# Patient Record
Sex: Female | Born: 1990 | Race: White | Hispanic: No | Marital: Single | Smoking: Former smoker
Health system: Southern US, Community
[De-identification: ages and names within clinical notes are randomized; demographics above are authoritative.]

## PROBLEM LIST (undated history)

## (undated) DIAGNOSIS — N390 Urinary tract infection, site not specified: Secondary | ICD-10-CM

## (undated) HISTORY — DX: Urinary tract infection, site not specified: N39.0

---
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
## 2010-04-02 DIAGNOSIS — N390 Urinary tract infection, site not specified: Secondary | ICD-10-CM

## 2010-04-02 HISTORY — DX: Urinary tract infection, site not specified: N39.0

## 2010-07-17 LAB — CBC
HCT: 29.2 % — ABNORMAL LOW (ref 36.0–49.0)
HCT: 34.6 % — ABNORMAL LOW (ref 36.0–49.0)
Hemoglobin: 10.2 g/dL — ABNORMAL LOW (ref 12.0–16.0)
Hemoglobin: 11.9 g/dL — ABNORMAL LOW (ref 12.0–16.0)
MCHC: 34.4 g/dL (ref 31.0–37.0)
MCHC: 35 g/dL (ref 31.0–37.0)
MCV: 96.6 fL (ref 78.0–98.0)
MCV: 96.9 fL (ref 78.0–98.0)
Platelets: 183 10*3/uL (ref 150–400)
Platelets: 231 10*3/uL (ref 150–400)
RBC: 3.01 MIL/uL — ABNORMAL LOW (ref 3.80–5.70)
RBC: 3.59 MIL/uL — ABNORMAL LOW (ref 3.80–5.70)
RDW: 14.4 % (ref 11.4–15.5)
RDW: 14.6 % (ref 11.4–15.5)
WBC: 10.6 10*3/uL (ref 4.5–13.5)
WBC: 13.9 10*3/uL — ABNORMAL HIGH (ref 4.5–13.5)

## 2010-07-17 LAB — RPR: RPR Ser Ql: NONREACTIVE

## 2010-08-15 NOTE — Op Note (Signed)
NAME:  Kathryn Alvarez, Kathryn Alvarez               ACCOUNT NO.:  [id number]   MEDICAL RECORD NO.:  [id number]          PATIENT TYPE:  INP   LOCATION:  9134                          FACILITY:  [facility]   PHYSICIAN:  Roseanna Rainbow, M.D.DATE OF BIRTH:  1991-03-29   DATE OF PROCEDURE:  04/28/2008  DATE OF DISCHARGE:                               OPERATIVE REPORT   PREOPERATIVE DIAGNOSES:  1. Intrauterine pregnancy at term.  2. Arrest of descent.  3. Persistent occiput posterior.  4. Repetitive variable decelerations.   POSTOPERATIVE DIAGNOSES:  1. Intrauterine pregnancy at term.  2. Arrest of descent.  3. Persistent occiput posterior.  4. Repetitive variable decelerations.   PROCEDURE:  Primary low uterine flap elliptical cesarean delivery via a  transverse incision.   SURGEON:  Roseanna Rainbow, MD   ANESTHESIA:  Epidural.   FINDINGS:  The fetus was direct OP.  She was delivered of a live born  female.  Apgars 8 and 9 at one and five minutes respectively, the weight  was 7 pounds 15 ounces.  An umbilical artery pH was 7.31.   PATHOLOGY:  Placenta.   ESTIMATED BLOOD LOSS:  600 mL.   COMPLICATIONS:  Extension of the uterine incision at the left margin and  there was gross hematuria that was clearing at the end of the procedure.   PROCEDURE:  The patient was taken to the operating room with an epidural  catheter in situ and an IV running.  She was placed in the dorsal supine  position with a leftward tilt and prepped and draped in the usual  sterile fashion.  After a time-out had been completed, transverse skin  incision was then made with a scalpel and carried down to the underlying  fascia.  The fascia was nicked in the midline and the fascial incision  was then extended bilaterally.  The superior aspect of the fascial  incision was tented up and the underlying rectus muscles dissected off.  The inferior aspect of the fascial incision was manipulated in a similar  fashion.  The  rectus muscles were separated in the midline.  The  parietal peritoneum was tented up and entered sharply.  This incision  was then extended superiorly and inferiorly with good visualization of  the bladder.  The bladder blade was then placed.  The vesicouterine  peritoneum was tented up and entered sharply.  This incision was then  extended bilaterally and a bladder flap created bluntly.  Please note  that the bladder was slightly distended.  The lower uterine segment was  then incised in a transverse fashion, the uterine incision was then  extended bluntly.  The infant's head was delivered atraumatically with  assistance from below as well as elevation of the shoulders from above.  The oropharynx was suctioned with bulb suction.  The cord was clamped  and cut.  The infant was handed off to the awaiting neonatologist.  The  placenta was then removed.  The intrauterine cavity was evacuated of any  remaining amniotic fluid clots and debris with a moist laparotomy  sponge.  The 2-cm extension down towards  the cervix extending from the  left margin of the uterine incision was repaired using a running  interlocking suture of 0-Monocryl.  An imbricating layer of the same  suture was placed.  The remainder of the uterine incision was closed in  the usual fashion using a running interlocking suture of 0-Monocryl.  A  second imbricating layer of the same suture was then placed.  The  paracolic gutters were irrigated copiously.  The uterine incision was re-  inspected and felt to be hemostatic.  The parietal peritoneum was then  reapproximated in a running fashion using a suture of 2-0 Vicryl.  The  fascia was closed in a running fashion using two sutures of 0-Vicryl  tied in the midline.  The skin was closed with staples.  At the close of  the procedure, the instrument and pack counts were said to be correct  x2.  The patient was taken to the PACU awake and in stable condition.      Roseanna Rainbow, M.D.  Electronically Signed     LAJ/MEDQ  D:  04/28/2008  T:  04/28/2008  Job:  [id number]

## 2010-08-18 NOTE — Discharge Summary (Signed)
NAME:  Kathryn Alvarez, Kathryn Alvarez               ACCOUNT NO.:  [id number]   MEDICAL RECORD NO.:  [id number]          PATIENT TYPE:  INP   LOCATION:  9134                          FACILITY:  [facility]   PHYSICIAN:  Charles A. Clearance Coots, M.D.DATE OF BIRTH:  07-31-1990   DATE OF ADMISSION:  04/26/2008  DATE OF DISCHARGE:  04/30/2008                               DISCHARGE SUMMARY   ADMITTING DIAGNOSIS:  Post dates pregnancy, induction of labor.   DISCHARGE DIAGNOSES:  Post dates pregnancy, induction of labor status  post primary low transverse cesarean section on April 28, 2008, for  arrest of descent, persistent occiput posterior position, and repetitive  variable fetal heart rate decelerations.   Viable female infant was delivered on April 28, 2008, at 3:14.  Apgars 8  at one minute and 9 at five minutes, weight of 7 pounds and 15 ounces,  3615 g.  Mother and infant discharged home in good condition.   REASON FOR ADMISSION:  A 20 year old G1, estimated date of confinement  of April 21, 2008, presents for induction of labor for post dates.   PAST MEDICAL HISTORY:  Surgery:  Breast biopsy.  Illnesses:  None.   MEDICATIONS:  Prenatal vitamins.   ALLERGIES:  No known drug allergies.   SOCIAL HISTORY:  Single.  Positive tobacco.  Negative alcohol or  recreational drug use.   FAMILY HISTORY:  Positive for diabetes.   PHYSICAL EXAMINATION:  GENERAL:  Well-nourished, well-developed female  in no acute distress, afebrile.  VITAL SIGNS:  Stable.  LUNGS:  Clear to auscultation bilaterally.  HEART:  Regular rate and rhythm.  ABDOMEN:  Gravid, nontender.  Cervix fingertip, 50% effaced, vertex at -  2 station.   HOSPITAL COURSE:  The patient was admitted, two-stage induction was  begun with cervical ripening overnight followed by Pitocin the following  morning.  The patient progressed to 6 cm dilatation and then made  further progress to full dilatation, but with increased caput and  occiput posterior  position.  Fetal heart rate tracing then revealed  moderate variable fetal heart rate decelerations with minimal descent of  the vertex with increased caput.  The patient was managed conservatively  for the next 2-3 hours and had severe variable fetal heart rate  decelerations with pushing effort and no further descent of the vertex.  A decision was made to proceed with cesarean section delivery for arrest  of descent with persistent occiput posterior position and repetitive  variable fetal heart rate deceleration.  Primary low transverse cesarean  section was performed on April 28, 2008.  There were no intraoperative  complications.  Postoperative course was uncomplicated.  The patient was  discharged home on postop day #3 in good condition.   DISCHARGE LABORATORY DATA:  Hemoglobin 10, hematocrit 29.2, white blood  cell count 13,900, platelets 183,000.   DISCHARGE DISPOSITION:  Medication, continue prenatal vitamins,  ibuprofen and Percocet were prescribed for pain.  Routine written  instructions were given for discharge after cesarean section.  The  patient is to call the office for followup appointment in 2 weeks.      Leonette Most  Julio Alm, M.D.  Electronically Signed     CAH/MEDQ  D:  06/04/2008  T:  06/05/2008  Job:  [id number]

## 2010-11-10 LAB — ABO/RH: RH Type: POSITIVE

## 2010-11-10 LAB — ANTIBODY SCREEN: Antibody Screen: NEGATIVE

## 2011-01-02 LAB — CBC
HCT: 31 — ABNORMAL LOW
Hemoglobin: 10.8 — ABNORMAL LOW
MCHC: 35
MCV: 95.7
Platelets: 245
RBC: 3.24 — ABNORMAL LOW
RDW: 13.6
WBC: 9.6

## 2011-01-03 LAB — URINALYSIS, ROUTINE W REFLEX MICROSCOPIC
Bilirubin Urine: NEGATIVE
Glucose, UA: NEGATIVE
Hgb urine dipstick: NEGATIVE
Ketones, ur: NEGATIVE
Nitrite: NEGATIVE
Protein, ur: NEGATIVE
Specific Gravity, Urine: 1.025
Urobilinogen, UA: 0.2
pH: 6.5

## 2011-02-01 LAB — CBC: Platelets: 237 10*3/uL (ref 150–399)

## 2011-02-11 LAB — HEPATITIS B SURFACE ANTIGEN: Hepatitis B Surface Ag: NEGATIVE

## 2011-02-11 LAB — RPR: RPR: NONREACTIVE

## 2011-02-11 LAB — GC/CHLAMYDIA PROBE AMP, GENITAL
Chlamydia: NEGATIVE
Gonorrhea: NEGATIVE

## 2011-02-11 LAB — RUBELLA ANTIBODY, IGM: Rubella: IMMUNE

## 2011-02-11 LAB — VARICELLA ZOSTER ANTIBODY, IGG: Varicella: IMMUNE

## 2011-02-11 LAB — HIV ANTIBODY (ROUTINE TESTING W REFLEX): HIV: NONREACTIVE

## 2011-04-03 NOTE — L&D Delivery Note (Signed)
Operative Delivery Note At 4:54 PM a viable female was delivered via Vaginal, [employer], operational).  Presentation: vertex; Position: Right,, Occiput,, Anterior; Station: +3.  Verbal consent: obtained from patient.  Risks and benefits discussed in detail.  Risks include, but are not limited to the risks of anesthesia, bleeding, infection, damage to maternal tissues, fetal cephalhematoma.  There is also the risk of inability to effect vaginal delivery of the head, or shoulder dystocia that cannot be resolved by established maneuvers, leading to the need for emergency cesarean section.  APGAR: , ; weight .   Placenta status: , .   Cord: 3 vessels with the following complications: .  Cord pH: 7.29  Anesthesia: Epidural  Instruments: kiwi Episiotomy: none Lacerations: none Suture Repair: n/a Est. Blood Loss350 (mL):   Mom to postpartum.  Baby to nursery-stable.  Kathryn Alvarez DARLENE 06/03/2011, 5:11 PM

## 2011-04-19 LAB — CBC
HCT: 36 % (ref 36–46)
Hemoglobin: 11.5 g/dL — AB (ref 12.0–16.0)
Platelets: 265 10*3/uL (ref 150–399)

## 2011-04-19 LAB — RPR: RPR: NONREACTIVE

## 2011-04-19 LAB — HIV ANTIBODY (ROUTINE TESTING W REFLEX): HIV: NONREACTIVE

## 2011-05-29 DIAGNOSIS — O34219 Maternal care for unspecified type scar from previous cesarean delivery: Secondary | ICD-10-CM | POA: Insufficient documentation

## 2011-05-29 DIAGNOSIS — Z98891 History of uterine scar from previous surgery: Secondary | ICD-10-CM

## 2011-05-31 ENCOUNTER — Encounter: Payer: Self-pay | Admitting: *Deleted

## 2011-05-31 ENCOUNTER — Encounter: Payer: Self-pay | Admitting: Physician Assistant

## 2011-05-31 ENCOUNTER — Ambulatory Visit (INDEPENDENT_AMBULATORY_CARE_PROVIDER_SITE_OTHER): Payer: Self-pay | Admitting: Physician Assistant

## 2011-05-31 VITALS — BP 113/72 | HR 89 | Temp 97.9°F | Ht 62.0 in | Wt 195.2 lb

## 2011-05-31 DIAGNOSIS — O34219 Maternal care for unspecified type scar from previous cesarean delivery: Secondary | ICD-10-CM

## 2011-05-31 DIAGNOSIS — Z98891 History of uterine scar from previous surgery: Secondary | ICD-10-CM

## 2011-05-31 LAB — POCT URINALYSIS DIP (DEVICE)
Bilirubin Urine: NEGATIVE
Glucose, UA: NEGATIVE mg/dL
Hgb urine dipstick: NEGATIVE
Ketones, ur: NEGATIVE mg/dL
Nitrite: NEGATIVE
Protein, ur: NEGATIVE mg/dL
Specific Gravity, Urine: 1.025 (ref 1.005–1.030)
Urobilinogen, UA: 1 mg/dL (ref 0.0–1.0)
pH: 7 (ref 5.0–8.0)

## 2011-05-31 LAB — STREP B DNA PROBE: GBS: NEGATIVE

## 2011-05-31 NOTE — Progress Notes (Signed)
Pulse 89. Edema trace on feet. No c/o pain. Pressure on pelvica area.

## 2011-05-31 NOTE — Patient Instructions (Signed)
Vaginal Birth After Cesarean Delivery Vaginal birth after Cesarean delivery (VBAC) is giving birth vaginally after previously delivering a baby by a cesarean. In the past, if a woman had a Cesarean delivery, all births afterwards would be done by Cesarean delivery. This is no longer true. It can be safe for the mother to try a vaginal delivery after having a Cesarean. The final decision to have a VBAC or repeat Cesarean delivery should be between the patient and her caregiver. The risks and benefits can be discussed relative to the reason for, and the type of the previous Cesarean delivery. WOMEN WHO PLAN TO HAVE A VBAC SHOULD CHECK WITH THEIR DOCTOR TO BE SURE THAT:  The previous Cesarean was done with a low transverse uterine incision (not a vertical classical incision).   The birth canal is big enough for the baby.   There were no other operations on the uterus.   They will have an electronic fetal monitor (EFM) on at all times during labor.   An operating room would be available and ready in case an emergency Cesarean is needed.   A doctor and surgical nursing staff would be available at all times during labor to be ready to do an emergency Cesarean if necessary.   An anesthesiologist would be present in case an emergency Cesarean is needed.   The nursery is prepared and has adequate personnel and necessary equipment available to care for the baby in case of an emergency Cesarean.  BENEFITS OF VBAC:  Shorter stay in the hospital.   Lower delivery, nursery and hospital costs.   Less blood loss and need for blood transfusions.   Less fever and discomfort from major surgery.   Lower risk of blood clots.   Lower risk of infection.   Shorter recovery after going home.   Lower risk of other surgical complications, such as opening of the incision or hernia in the incision.   Decreased risk of injury to other organs.   Decreased risk for having to remove the uterus (hysterectomy).     Decreased risk for the placenta to completely or partially cover the opening of the uterus (placenta previa) with a future pregnancy.   Ability to have a larger family if desired.  RISKS OF A VBAC:  Rupture of the uterus.   Having to remove the uterus (hysterectomy) if it ruptures.   All the complications of major surgery and/or injury to other organs.   Excessive bleeding, blood clots and infection.   Lower Apgar scores (method to evaluate the newborn based on appearance, pulse, grimace, activity, and respiration) and more risks to the baby.   There is a higher risk of uterine rupture if you induce or augment labor.   There is a higher risk of uterine rupture if you use medications to ripen the cervix.  VBAC SHOULD NOT BE DONE IF:  The previous Cesarean was done with a vertical (classical) or T-shaped incision, or you do not know what kind of an incision was made.   You had a ruptured uterus.   You had surgery on your uterus.   You have medical or obstetrical problems.   There are problems with the baby.   There were two previous Cesarean deliveries and no vaginal deliveries.  OTHER FACTS TO KNOW ABOUT VBAC:  It is safe to have an epidural anesthetic with VBAC.   It is safe to turn the baby from a breech position (attempt an external cephalic version).   It is  safe to try a VBAC with twins.   Pregnancies later than 40 weeks have not been successful with VBAC.   There is an increased failure rate of a VBAC in obese pregnant women.   There is an increased failure rate with VABC if the baby weighs 8.8 pounds (4000 grams) or more.   There is an increased failure rate if the time between the Cesarean and VBAC is less than 19 months.   There is an increased failure rate if pre-eclampsia is present (high blood pressure, protein in the urine and swelling of face and extremities).   VBAC is very successful if there was a previous vaginal birth.   VBAC is very successful  when the labor starts spontaneously before the due date.   Delivery of VBAC is similar to having a normal spontaneous vaginal delivery.  It is important to discuss VBAC with your caregiver early in the pregnancy so you can understand the risks, benefits and options. It will give you time to decide what is best in your particular case relevant to the reason for your previous Cesarean delivery. It should be understood that medical changes in the mother or pregnancy may occur during the pregnancy, which make it necessary to change you or your caregiver's initial decision. The counseling, concerns and decisions should be documented in the medical record and signed by all parties. Document Released: 09/09/2006 Document Revised: 11/29/2010 Document Reviewed: 04/30/2008 [facility] Patient Information 2012 [place], [place].Natural Childbirth Natural childbirth is going through labor and delivery without any drugs to relieve pain. You also do not use fetal monitors, have a cesarean delivery, or get a sugical cut to enlarge the vaginal opening (episiotomy). With the help of a birthing professional (midwife), you will direct your own labor and delivery as you choose. Many women chose natural childbirth because they feel more in control and in touch with their labor and delivery. They are also concerned about the medications affecting themselves and the baby. Pregnant women with a high risk pregnancy should not attempt natural childbirth. It is better to deliver the infant in a hospital if an emergency situation arises. Sometimes, the caregiver has to intervene for the health and safety of the mother and infant. TWO TECHNIQUES FOR NATURAL CHILDBIRTH:   The Lamaze method. This method teaches women that having a baby is normal, healthy, and natural. It also teaches the mother to take a neutral position regarding pain medication and anesthesia and to make an informed decision if and when it is right for them.   The  Erven Colla (also called husband coached birth). This method teaches the father to be the birth coach and stresses a natural approach. It also encourages exercise and a balanced diet with good nutrition. The exercises teach relaxation and deep breathing techniques. However, there are also classes to prepare the parents for an emergency situation that may occur.  METHODS OF DEALING WITH LABOR PAIN AND DELIVERY:  Meditation.   Yoga.   Hypnosis.   Acupuncture.   Massage.   Changing positions (walking, rocking, showering, leaning on birth balls).   Lying in warm water or a jacuzzi.   Find an activity that keeps your mind off of the labor pain.   Listen to soft music.   Visual imagery (focus on a particular object).  BEFORE GOING INTO LABOR  Be sure you and your spouse/partner are in agreement to have natural childbirth.   Decide if your caregiver or a midwife will deliver your baby.  Decide if you will have your baby in the hospital, birthing center, or at home.   If you have children, make plans to have someone to take care of them when you go to the hospital.   Know the distance and the time it takes to go to the delivery center. Make a dry run to be sure.   Have a bag packed with a night gown, bathrobe, and toiletries ready to take when you go into labor.   Keep phone numbers of your family and friends handy if you need to call someone when you go into labor.   Your spouse or partner should go to all the teaching classes.   Talk with your caregiver about the possibility of a medical emergency and what will happen if that occurs.  ADVANTAGES OF NATURAL CHILDBIRTH  You are in control of your labor and delivery.   It is safe.   There are no medications or anesthetics that may affect you and the fetus.   There are no invasive procedures such as an episiotomy.   You and your partner will work together, which can increase your bond.   Meditation, yoga, massage, and  breathing exercises can be learned while pregnant and help you when you are in labor and at delivery.   In most delivery centers, the family and friends can be involved in the labor and delivery process.  DISADVANTAGES OF NATURAL CHILDBIRTH  You will experience pain during your labor and delivery.   The methods of helping relieve your labor pains may not work for you.   You may feel embarrassed, disappointed, and like a failure if you decide to change your mind during labor and not have natural childbirth.  AFTER THE DELIVERY  You will be very tired.   You will be uncomfortable because of your uterus contracting. You will feel soreness around the vagina.   You may feel cold and shaky.This is a natural reaction.   You will be excited, overwhelmed, accomplished, and proud to be a mother.  HOME CARE INSTRUCTIONS   Follow the advice and instructions of your caregiver.   Follow the instructions of your natural childbirth instructor (Lamaze or Bradley Method).  Document Released: 03/01/2008 Document Revised: 11/29/2010 Document Reviewed: 03/01/2008 [facility] Patient Information 2012 [place], [place].

## 2011-05-31 NOTE — Progress Notes (Signed)
   Subjective:    Kathryn Alvarez is a G3P2002 [redacted]w[redacted]d being seen today for her first obstetrical visit.  Her obstetrical history is significant for previous c-section x 2. First at 41 week for FTD after 3 hour 2nd stage, 2nd: RLTCS at 39 weeks. Patient uncertain intend to breast feed. Pregnancy history fully reviewed. Transfer of care from [facility] practice in [place]. Uncomplicated pregnancy. Planning TOLAC in [place], has continued desire.   Patient reports no complaints  Filed Vitals:   05/31/11 1001 05/31/11 1003  BP: 113/72   Pulse:  89  Temp: 97.9 F (36.6 C)   Height:  5\' 2"  (1.575 m)  Weight: 195 lb 3.2 oz (88.542 kg)     HISTORY: OB History    Grav Para Term Preterm Abortions TAB SAB Ect Mult Living   3 2 2       2      # Outc Date GA Lbr Len/2nd Wgt Sex Del Anes PTL Lv   1 TRM 3/10 [redacted]w[redacted]d  7lb15oz(3.6kg) M CS EPI     2 TRM 3/12 [redacted]w[redacted]d  7lb10oz(3.459kg) M CS EPI  Yes   3 CUR              Past Medical History  Diagnosis Date  . UTI (lower urinary tract infection) 2012    during 2 pregnancy  . 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 Cesarean section   . Breast fibroadenoma surgery 2006   Family History  Problem Relation Age of Onset  . Cancer Mother     cervical  . Asthma Brother   . Cancer Maternal Aunt     cervical  . Cancer Maternal Grandmother   . Diabetes Maternal Grandmother   . Asthma Maternal Grandmother      Exam    Uterine Size: 39 cm  Pelvic Exam:    Perineum: Normal Perineum   Vulva: normal   Vagina:  normal mucosa   Cervix: 4-5/70/vtx/-2   Adnexa: not evaluated   Bony Pelvis: gynecoid  System: Breast:  normal appearance, no masses or tenderness, Inspection negative   Skin: normal coloration and turgor, no rashes    Neurologic: oriented, normal, gait normal; reflexes normal and symmetric   Extremities: normal strength, tone, and muscle mass   HEENT NL appearance   Mouth/Teeth mucous membranes moist,  pharynx normal without lesions   Respiratory:  appears well, vitals normal, no respiratory distress, acyanotic, normal RR   Abdomen: Gravid nontender      Assessment:    Pregnancy: [id number] Patient Active Problem List  Diagnoses  . H/O: C-section    Desires TOLAC    Plan:    VBAC consent Reviewed plan for expectant management to 41 weeks given no risk factors. Will start antenatal testing at next visit for post-term. Will post RLTCS at next visit for 41 weeks if no labor this week. Problem list reviewed and updated. Labor precautions Benefits of breastfeeding reviewed, pt undecided.  Rosi Secrist E. 05/31/2011

## 2011-06-03 ENCOUNTER — Encounter (HOSPITAL_COMMUNITY): Payer: Self-pay | Admitting: Obstetrics and Gynecology

## 2011-06-03 ENCOUNTER — Encounter (HOSPITAL_COMMUNITY): Payer: Self-pay | Admitting: Anesthesiology

## 2011-06-03 ENCOUNTER — Inpatient Hospital Stay (HOSPITAL_COMMUNITY): Payer: Medicaid Other | Admitting: Anesthesiology

## 2011-06-03 ENCOUNTER — Inpatient Hospital Stay (HOSPITAL_COMMUNITY)
Admission: AD | Admit: 2011-06-03 | Discharge: 2011-06-04 | DRG: 775 | Disposition: A | Discharge status: Home / Self Care | Payer: Medicaid Other | Source: Ambulatory Visit | Attending: Obstetrics & Gynecology | Admitting: Obstetrics & Gynecology

## 2011-06-03 DIAGNOSIS — O34219 Maternal care for unspecified type scar from previous cesarean delivery: Secondary | ICD-10-CM

## 2011-06-03 LAB — CBC
Hemoglobin: 10.9 g/dL — ABNORMAL LOW (ref 12.0–15.0)
MCH: 32.5 pg (ref 26.0–34.0)
MCHC: 33.6 g/dL (ref 30.0–36.0)
MCV: 96.7 fL (ref 78.0–100.0)
RBC: 3.35 MIL/uL — ABNORMAL LOW (ref 3.87–5.11)

## 2011-06-03 LAB — GC/CHLAMYDIA PROBE AMP, GENITAL
Chlamydia: NEGATIVE
Gonorrhea: NEGATIVE

## 2011-06-03 LAB — CULTURE, BETA STREP (GROUP B ONLY)

## 2011-06-03 MED ORDER — DIPHENHYDRAMINE HCL 25 MG PO CAPS: 25.0000 mg | ORAL_CAPSULE | Freq: Four times a day (QID) | ORAL | Status: DC | PRN

## 2011-06-03 MED ORDER — ONDANSETRON HCL 4 MG/2ML IJ SOLN: 8.0000 mg | INTRAMUSCULAR | Status: DC

## 2011-06-03 MED ORDER — PHENYLEPHRINE 40 MCG/ML (10ML) SYRINGE FOR IV PUSH (FOR BLOOD PRESSURE SUPPORT): 80.0000 ug | PREFILLED_SYRINGE | INTRAVENOUS | Status: DC | PRN

## 2011-06-03 MED ORDER — SENNOSIDES-DOCUSATE SODIUM 8.6-50 MG PO TABS
2.0000 | ORAL_TABLET | Freq: Every day | ORAL | Status: DC
Start: 2011-06-03 — End: 2011-06-04
  Administered 2011-06-03: 2 via ORAL

## 2011-06-03 MED ORDER — TETANUS-DIPHTH-ACELL PERTUSSIS 5-2.5-18.5 LF-MCG/0.5 IM SUSP: 0.5000 mL | Freq: Once | INTRAMUSCULAR | Status: DC

## 2011-06-03 MED ORDER — ONDANSETRON HCL 4 MG/2ML IJ SOLN: 4.0000 mg | Freq: Four times a day (QID) | INTRAMUSCULAR | Status: DC | PRN

## 2011-06-03 MED ORDER — EPHEDRINE 5 MG/ML INJ: 10.0000 mg | INTRAVENOUS | Status: DC | PRN

## 2011-06-03 MED ORDER — WITCH HAZEL-GLYCERIN EX PADS: 1.0000 "application " | MEDICATED_PAD | CUTANEOUS | Status: DC | PRN

## 2011-06-03 MED ORDER — FENTANYL 2.5 MCG/ML BUPIVACAINE 1/10 % EPIDURAL INFUSION (WH - ANES)
14.0000 mL/h | INTRAMUSCULAR | Status: DC
  Administered 2011-06-03: 14 mL/h via EPIDURAL

## 2011-06-03 MED ORDER — CITRIC ACID-SODIUM CITRATE 334-500 MG/5ML PO SOLN: 30.0000 mL | ORAL | Status: DC | PRN

## 2011-06-03 MED ORDER — LACTATED RINGERS IV SOLN: 500.0000 mL | Freq: Once | INTRAVENOUS | Status: DC

## 2011-06-03 MED ORDER — LACTATED RINGERS IV SOLN: 500.0000 mL | INTRAVENOUS | Status: DC | PRN

## 2011-06-03 MED ORDER — PHENYLEPHRINE 40 MCG/ML (10ML) SYRINGE FOR IV PUSH (FOR BLOOD PRESSURE SUPPORT)
PREFILLED_SYRINGE | INTRAVENOUS | Status: AC
  Filled 2011-06-03: qty 5

## 2011-06-03 MED ORDER — IBUPROFEN 600 MG PO TABS
600.0000 mg | ORAL_TABLET | Freq: Four times a day (QID) | ORAL | Status: DC | PRN
  Administered 2011-06-03: 600 mg via ORAL
  Filled 2011-06-03: qty 1

## 2011-06-03 MED ORDER — DIPHENHYDRAMINE HCL 50 MG/ML IJ SOLN: 12.5000 mg | INTRAMUSCULAR | Status: DC | PRN

## 2011-06-03 MED ORDER — TERBUTALINE SULFATE 1 MG/ML IJ SOLN
INTRAMUSCULAR | Status: AC
  Filled 2011-06-03: qty 1

## 2011-06-03 MED ORDER — ONDANSETRON HCL 4 MG PO TABS: 4.0000 mg | ORAL_TABLET | ORAL | Status: DC | PRN

## 2011-06-03 MED ORDER — LIDOCAINE HCL (PF) 1 % IJ SOLN
INTRAMUSCULAR | Status: DC | PRN
  Administered 2011-06-03 (×3): 4 mL

## 2011-06-03 MED ORDER — EPHEDRINE 5 MG/ML INJ
INTRAVENOUS | Status: AC
  Filled 2011-06-03: qty 4

## 2011-06-03 MED ORDER — ONDANSETRON 8 MG/NS 50 ML IVPB
8.0000 mg | Freq: Once | INTRAVENOUS | Status: AC
  Administered 2011-06-03: 8 mg via INTRAVENOUS
  Filled 2011-06-03: qty 8

## 2011-06-03 MED ORDER — LANOLIN HYDROUS EX OINT: TOPICAL_OINTMENT | CUTANEOUS | Status: DC | PRN

## 2011-06-03 MED ORDER — OXYTOCIN 20 UNITS IN LACTATED RINGERS INFUSION - SIMPLE
125.0000 mL/h | Freq: Once | INTRAVENOUS | Status: DC
  Administered 2011-06-03: 999 mL/h via INTRAVENOUS

## 2011-06-03 MED ORDER — DIBUCAINE 1 % RE OINT: 1.0000 "application " | TOPICAL_OINTMENT | RECTAL | Status: DC | PRN

## 2011-06-03 MED ORDER — FENTANYL 2.5 MCG/ML BUPIVACAINE 1/10 % EPIDURAL INFUSION (WH - ANES)
INTRAMUSCULAR | Status: AC
  Filled 2011-06-03: qty 60

## 2011-06-03 MED ORDER — FLEET ENEMA 7-19 GM/118ML RE ENEM: 1.0000 | ENEMA | RECTAL | Status: DC | PRN

## 2011-06-03 MED ORDER — LACTATED RINGERS IV SOLN
INTRAVENOUS | Status: DC
  Administered 2011-06-03: 15:00:00 via INTRAVENOUS

## 2011-06-03 MED ORDER — IBUPROFEN 600 MG PO TABS
600.0000 mg | ORAL_TABLET | Freq: Four times a day (QID) | ORAL | Status: DC
  Administered 2011-06-04 (×2): 600 mg via ORAL
  Filled 2011-06-03 (×2): qty 1

## 2011-06-03 MED ORDER — ZOLPIDEM TARTRATE 5 MG PO TABS: 5.0000 mg | ORAL_TABLET | Freq: Every evening | ORAL | Status: DC | PRN

## 2011-06-03 MED ORDER — PRENATAL MULTIVITAMIN CH
1.0000 | ORAL_TABLET | Freq: Every day | ORAL | Status: DC
  Administered 2011-06-04: 1 via ORAL
  Filled 2011-06-03: qty 1

## 2011-06-03 MED ORDER — OXYCODONE-ACETAMINOPHEN 5-325 MG PO TABS: 1.0000 | ORAL_TABLET | ORAL | Status: DC | PRN

## 2011-06-03 MED ORDER — LIDOCAINE HCL (PF) 1 % IJ SOLN
30.0000 mL | INTRAMUSCULAR | Status: DC | PRN
  Filled 2011-06-03: qty 30

## 2011-06-03 MED ORDER — OXYTOCIN BOLUS FROM INFUSION
500.0000 mL | Freq: Once | INTRAVENOUS | Status: DC
  Filled 2011-06-03: qty 1000
  Filled 2011-06-03: qty 500

## 2011-06-03 MED ORDER — ACETAMINOPHEN 325 MG PO TABS: 650.0000 mg | ORAL_TABLET | ORAL | Status: DC | PRN

## 2011-06-03 MED ORDER — SIMETHICONE 80 MG PO CHEW: 80.0000 mg | CHEWABLE_TABLET | ORAL | Status: DC | PRN

## 2011-06-03 MED ORDER — BENZOCAINE-MENTHOL 20-0.5 % EX AERO: 1.0000 "application " | INHALATION_SPRAY | CUTANEOUS | Status: DC | PRN

## 2011-06-03 NOTE — H&P (Signed)
Kathryn Alvarez is a 21 y.o. female presenting for active labor. Maternal Medical History:  Reason for admission: Reason for admission: contractions.  Contractions: Onset was 3-5 hours ago.   Frequency: regular.   Perceived severity is moderate.    Fetal activity: Perceived fetal activity is normal.   Last perceived fetal movement was within the past hour.      OB History    Grav Para Term Preterm Abortions TAB SAB Ect Mult Living   3 2 2  0 0 0 0 0 0 2     Past Medical History  Diagnosis Date  . UTI (lower urinary tract infection) 2012    during 2 pregnancy   Past Surgical History  Procedure Date  . Cesarean section   . Breast fibroadenoma surgery 2006    bilat breast fibroids   Family History: family history includes Asthma in her brother and maternal grandmother; Cancer in her maternal aunt, maternal grandmother, and mother; and Diabetes in her maternal grandmother. Social History:  reports that she has been smoking Cigarettes.  She has a 1 pack-year smoking history. She has never used smokeless tobacco. She reports that she does not drink alcohol or use illicit drugs.  Review of Systems  Constitutional: Negative.   HENT: Negative.   Eyes: Negative.   Respiratory: Negative.   Cardiovascular: Negative.   Gastrointestinal: Positive for abdominal pain.  Genitourinary: Negative.   Musculoskeletal: Negative.   Skin: Negative.   Neurological: Negative.   Endo/Heme/Allergies: Negative.   Psychiatric/Behavioral: Negative.     Dilation: 7 Effacement (%): 100 Station: -1 Exam by:: J. Rasch RN  Blood pressure 146/78, pulse 107, temperature 97.9 F (36.6 C), temperature source Oral, resp. rate 18, height 5\' 2"  (1.575 m), weight 88.451 kg (195 lb). Maternal Exam:  Uterine Assessment: Contraction strength is moderate.  Contraction frequency is regular.   Abdomen: Patient reports no abdominal tenderness. Fetal presentation: vertex  Introitus: Normal vulva.   Fetal  Exam Fetal Monitor Review: Mode: ultrasound.   Baseline rate: 135.  Variability: moderate (6-25 bpm).   Pattern: no accelerations and no decelerations.    Fetal State Assessment: Category I - tracings are normal.     Physical Exam  Constitutional: She is oriented to person, place, and time. She appears well-developed and well-nourished.  HENT:  Head: Normocephalic.  Neck: Normal range of motion.  Cardiovascular: Normal rate, regular rhythm, normal heart sounds and intact distal pulses.   Respiratory: Effort normal and breath sounds normal.  GI: Soft. Bowel sounds are normal.  Genitourinary: Vagina normal and uterus normal.  Musculoskeletal: Normal range of motion.  Neurological: She is alert and oriented to person, place, and time. She has normal reflexes.  Skin: Skin is warm and dry.  Psychiatric: She has a normal mood and affect. Her behavior is normal. Judgment and thought content normal.    Prenatal labs: ABO, Rh: O/Positive/-- (08/10 0000) Antibody: Negative (08/10 0000) Rubella: Immune (11/11 0000) RPR: Nonreactive (01/17 0000)  HBsAg: Negative (11/11 0000)  HIV: Non-reactive (01/17 0000)  GBS: Negative (02/28 0000)   Assessment/Plan: Admit, anticipate vag delivery.   Kathryn Alvarez 06/03/2011, 3:02 PM

## 2011-06-03 NOTE — Progress Notes (Signed)
Vomited small amt of clear mucous

## 2011-06-03 NOTE — Anesthesia Procedure Notes (Signed)
Epidural Patient location during procedure: OB Start time: 06/03/2011 3:34 PM Reason for block: procedure for pain  Staffing Performed by: anesthesiologist   Preanesthetic Checklist Completed: patient identified, site marked, surgical consent, pre-op evaluation, timeout performed, IV checked, risks and benefits discussed and monitors and equipment checked  Epidural Patient position: sitting Prep: site prepped and draped and DuraPrep Patient monitoring: continuous pulse ox and blood pressure Approach: midline Injection technique: LOR air  Needle:  Needle type: Tuohy  Needle gauge: 17 G Needle length: 9 cm Needle insertion depth: 6 cm Catheter type: closed end flexible Catheter size: 19 Gauge Catheter at skin depth: 11 cm Test dose: negative  Assessment Events: blood not aspirated, injection not painful, no injection resistance, negative IV test and no paresthesia  Additional Notes Discussed risk of headache, infection, bleeding, nerve injury and failed or incomplete block.  Patient voices understanding and wishes to proceed.

## 2011-06-03 NOTE — H&P (Signed)
Pt reconsented for VBAC.  Pt understands risk of uterine rupture, fetal distress and death, maternal hemorrhage needing hysterectomy or death.  Type and cross x 2 units.  Anesthesia aware.

## 2011-06-03 NOTE — Progress Notes (Signed)
Pt presents to [facility]U with chief complaint of contractions. Labor started this morning at 0530; pt is a G3P2 prior c-section times 2. Pt desires trial of labor for attempted VBAC.

## 2011-06-03 NOTE — Progress Notes (Signed)
Patient ID: Kathryn Alvarez, female   DOB: 18-Sep-1990, 20 y.o.   MRN: [id number] @ 1604 IUPC was placed without difficulty. accel noted in FHR pattern, mod variability. Pt tolerated procedure well. (cm/+1 station.

## 2011-06-03 NOTE — Progress Notes (Addendum)
Started pushing c Dr Penne Lash

## 2011-06-03 NOTE — Progress Notes (Addendum)
LOT position rotated tp ROA by Dr Penne Lash

## 2011-06-03 NOTE — Anesthesia Postprocedure Evaluation (Signed)
  Anesthesia Post-op Note  Patient: Kathryn Alvarez  Procedure(s) Performed: * No procedures listed *  Patient Location: PACU and Mother/Baby  Anesthesia Type: Epidural  Level of Consciousness: awake, alert  and oriented  Airway and Oxygen Therapy: Patient Spontanous Breathing   Post-op Assessment: Patient's Cardiovascular Status Stable and Respiratory Function Stable  Post-op Vital Signs: stable  Complications: No apparent anesthesia complications

## 2011-06-03 NOTE — Progress Notes (Signed)
Kathryn Alvarez is a delivery at this time. Notified her of patient arrival and cervical exam. Admit to L/D.

## 2011-06-03 NOTE — Anesthesia Preprocedure Evaluation (Signed)
Anesthesia Evaluation  Patient identified by MRN, date of birth, ID band Patient awake    Reviewed: Allergy & Precautions, H&P , NPO status , Patient's Chart, lab work & pertinent test results, reviewed documented beta blocker date and time   History of Anesthesia Complications Negative for: history of anesthetic complications  Airway Mallampati: II TM Distance: >3 FB Neck ROM: full    Dental  (+) Teeth Intact   Pulmonary Current Smoker,  breath sounds clear to auscultation        Cardiovascular negative cardio ROS  Rhythm:regular Rate:Normal     Neuro/Psych negative neurological ROS  negative psych ROS   GI/Hepatic negative GI ROS, Neg liver ROS,   Endo/Other  obese  Renal/GU negative Renal ROS  negative genitourinary   Musculoskeletal   Abdominal   Peds  Hematology negative hematology ROS (+)   Anesthesia Other Findings   Reproductive/Obstetrics (+) Pregnancy (h/o c/s x2 (last was less than a year ago 06/19/10), for [facility])                           Anesthesia Physical Anesthesia Plan  ASA: II  Anesthesia Plan: Epidural   Post-op Pain Management:    Induction:   Airway Management Planned:   Additional Equipment:   Intra-op Plan:   Post-operative Plan:   Informed Consent: I have reviewed the patients History and Physical, chart, labs and discussed the procedure including the risks, benefits and alternatives for the proposed anesthesia with the patient or authorized representative who has indicated his/her understanding and acceptance.     Plan Discussed with:   Anesthesia Plan Comments:         Anesthesia Quick Evaluation

## 2011-06-04 MED ORDER — IBUPROFEN 600 MG PO TABS: 600.0000 mg | ORAL_TABLET | Freq: Four times a day (QID) | ORAL | Status: AC | PRN

## 2011-06-04 MED ORDER — OXYCODONE-ACETAMINOPHEN 5-325 MG PO TABS: 1.0000 | ORAL_TABLET | ORAL | Status: AC | PRN

## 2011-06-04 MED ORDER — MULTIVITAMINS PO TABS: 1.0000 | ORAL_TABLET | Freq: Every day | ORAL | Status: AC

## 2011-06-04 MED ORDER — RHO D IMMUNE GLOBULIN 1500 UNIT/2ML IJ SOLN
300.0000 ug | Freq: Once | INTRAMUSCULAR | Status: AC
  Administered 2011-06-04: 300 ug via INTRAMUSCULAR
  Filled 2011-06-04: qty 2

## 2011-06-04 NOTE — Progress Notes (Signed)
UR chart review completed.  

## 2011-06-04 NOTE — Discharge Summary (Signed)
Obstetric Discharge Summary Reason for Admission: onset of labor Prenatal Procedures: none Intrapartum Procedures: vacuum Postpartum Procedures: none Complications-Operative and Postpartum: none Hemoglobin  Date Value Range Status  06/03/2011 10.9* 12.0-15.0 (g/dL) Final     HCT  Date Value Range Status  06/03/2011 32.4* 36.0-46.0 (%) Final    Discharge Diagnoses: Term Pregnancy-delivered; Pt had a successful VBAC after 2 cesarean sections.  Vacuum used for bradycardia while head crowning.  Discharge Information: Date: 06/04/2011 Activity: pelvic rest Diet: routine Medications: multivitamin, ibuprofen, percocet Condition: stable Instructions: refer to practice specific booklet Discharge to: home   Newborn Data: Live born female  Birth Weight: 7 lb 7.4 oz (3385 g) APGAR: 7, 9  Home with mother.  Shelina Luo H. 06/04/2011, 7:07 AM

## 2011-06-04 NOTE — Discharge Instructions (Signed)
Levonorgestrel intrauterine device (IUD) What is this medicine? LEVONORGESTREL IUD (LEE voe nor jes trel) is a contraceptive (birth control) device. It is used to prevent pregnancy and to treat heavy bleeding that occurs during your period. It can be used for up to 5 years. This medicine may be used for other purposes; ask your health care provider or pharmacist if you have questions. What should I tell my health care provider before I take this medicine? They need to know if you have any of these conditions: -abnormal Pap smear -cancer of the breast, uterus, or cervix -diabetes -endometritis -genital or pelvic infection now or in the past -have more than one sexual partner or your partner has more than one partner -heart disease -history of an ectopic or tubal pregnancy -immune system problems -IUD in place -liver disease or tumor -problems with blood clots or take blood-thinners -use intravenous drugs -uterus of unusual shape -vaginal bleeding that has not been explained -an unusual or allergic reaction to levonorgestrel, other hormones, silicone, or polyethylene, medicines, foods, dyes, or preservatives -pregnant or trying to get pregnant -breast-feeding How should I use this medicine? This device is placed inside the uterus by a health care professional. Talk to your pediatrician regarding the use of this medicine in children. Special care may be needed. Overdosage: If you think you have taken too much of this medicine contact a poison control center or emergency room at once. NOTE: This medicine is only for you. Do not share this medicine with others. What if I miss a dose? This does not apply. What may interact with this medicine? Do not take this medicine with any of the following medications: -amprenavir -bosentan -fosamprenavir This medicine may also interact with the following medications: -aprepitant -barbiturate medicines for inducing sleep or treating  seizures -bexarotene -griseofulvin -medicines to treat seizures like carbamazepine, ethotoin, felbamate, oxcarbazepine, phenytoin, topiramate -modafinil -pioglitazone -rifabutin -rifampin -rifapentine -some medicines to treat HIV infection like atazanavir, indinavir, lopinavir, nelfinavir, tipranavir, ritonavir -St. John's wort -warfarin This list may not describe all possible interactions. Give your health care provider a list of all the medicines, herbs, non-prescription drugs, or dietary supplements you use. Also tell them if you smoke, drink alcohol, or use illegal drugs. Some items may interact with your medicine. What should I watch for while using this medicine? Visit your doctor or health care professional for regular check ups. See your doctor if you or your partner has sexual contact with others, becomes HIV positive, or gets a sexual transmitted disease. This product does not protect you against HIV infection (AIDS) or other sexually transmitted diseases. You can check the placement of the IUD yourself by reaching up to the top of your vagina with clean fingers to feel the threads. Do not pull on the threads. It is a good habit to check placement after each menstrual period. Call your doctor right away if you feel more of the IUD than just the threads or if you cannot feel the threads at all. The IUD may come out by itself. You may become pregnant if the device comes out. If you notice that the IUD has come out use a backup birth control method like condoms and call your health care provider. Using tampons will not change the position of the IUD and are okay to use during your period. What side effects may I notice from receiving this medicine? Side effects that you should report to your doctor or health care professional as soon as possible: -allergic reactions  like skin rash, itching or hives, swelling of the face, lips, or tongue -fever, flu-like symptoms -genital sores -high  blood pressure -no menstrual period for 6 weeks during use -pain, swelling, warmth in the leg -pelvic pain or tenderness -severe or sudden headache -signs of pregnancy -stomach cramping -sudden shortness of breath -trouble with balance, talking, or walking -unusual vaginal bleeding, discharge -yellowing of the eyes or skin Side effects that usually do not require medical attention (report to your doctor or health care professional if they continue or are bothersome): -acne -breast pain -change in sex drive or performance -changes in weight -cramping, dizziness, or faintness while the device is being inserted -headache -irregular menstrual bleeding within first 3 to 6 months of use -nausea This list may not describe all possible side effects. Call your doctor for medical advice about side effects. You may report side effects to FDA at 1-800-FDA-1088. Where should I keep my medicine? This does not apply. NOTE: This sheet is a summary. It may not cover all possible information. If you have questions about this medicine, talk to your doctor, pharmacist, or health care provider.  2012, [employer]. (04/09/2008 6:39:08 PM)Postpartum Care After Vaginal Delivery After you deliver your baby, you will stay in the hospital for 24 to 72 hours, unless there were problems with the labor or delivery, or you have medical problems. While you are in the hospital, you will receive help and instructions on how to care for yourself and your baby. Your doctor will order pain medicine, in case you need it. You will have a small amount of bleeding from your vagina and should change your sanitary pad frequently. Wash your hands thoroughly with soap and water for at least 20 seconds after changing pads and using the toilet. Let the nurses know if you begin to pass blood clots or your bleeding increases. Do not flush blood clots down the toilet before having the nurse look at them, to make sure there is no  placental tissue with them. If you had an intravenous (IV), it will be removed within 24 hours, if there are no problems. The first time you get out of bed or take a shower, call the nurse to help you because you may get weak, lightheaded, or even faint. If you are breastfeeding, you may feel painful contractions of your uterus for a couple of weeks. This is normal. The contractions help your uterus get back to normal size. If you are not breastfeeding, wear a supportive bra and handle your breasts as little as possible until your milk has dried up. Hormones should not be given to dry up the breasts, because they can cause blood clots. You will be given your normal diet, unless you have diabetes or other medical problems.  The nurses may put an ice pack on your episiotomy (surgically enlarged opening), if you have one, to reduce the pain and swelling. On rare occasions, you may not be able to urinate and the nurse will need to empty your bladder with a catheter. If you had a postpartum tubal ligation ("tying tubes," female sterilization), it should not make your stay in the hospital longer. You may have your baby in your room with you as much as you like, unless you or the baby has a problem. Use the bassinet (basket) for the baby when going to and from the nursery. Do not carry the baby. Do not leave the postpartum area. If the mother is Rh negative (lacks a protein on the  red blood cells) and the baby is Rh positive, the mother should get a Rho-gam shot to prevent Rh problems with future pregnancies. You may be given written instructions for you and your baby, and necessary medicines, when you are discharged from the hospital. Be sure you understand and follow the instructions as advised. HOME CARE INSTRUCTIONS   Follow instructions and take the medicines given to you.   Only take over-the-counter or prescription medicines for pain, discomfort, or fever as directed by your caregiver.   Do not take  aspirin, because it can cause bleeding.   Increase your activities a little bit every day to build up your strength and endurance.   Do not drink alcohol, especially if you are breastfeeding or taking pain medicine.   Take your temperature twice a day and record it.   You may have a small amount of bleeding or spotting for 2 to 4 weeks. This is normal.   Do not use tampons or douche. Use sanitary pads.   Try to have someone stay and help you for a few days when you go home.   Try to rest or take a nap when the baby is sleeping.   If you are breastfeeding, wear a good support bra. If you are not breastfeeding, wear a supportive bra and do not stimulate your nipples.   Eat a healthy, nutritious diet and continue to take your prenatal vitamins.   Do not drive, do any heavy activities, or travel until your caregiver tells you it is okay.   Do not have intercourse until your caregiver gives you permission to do so.   Ask your caregiver when you can begin to exercise and what type of exercises to do.   Call your caregiver if you think you are having a problem from your delivery.   Call your pediatrician if you are having a problem with the baby.   Schedule your postpartum visit and keep it.  SEEK MEDICAL CARE IF:   You have a temperature of 100 F (37.8 C) or higher.   You have increased vaginal bleeding or are passing clots. Save any clots to show your caregiver.   You have bloody urine or pain when you urinate.   You have a bad smelling vaginal discharge.   You have increasing pain or swelling on your episiotomy.   You develop a severe headache.   You feel depressed.   The episiotomy is separating.   You become dizzy or lightheaded.   You develop a rash.   You have a reaction or problems with your medicine.   You have pain, redness, or swelling at the intravenous site.  SEEK IMMEDIATE MEDICAL CARE IF:   You have chest pain.   You develop shortness of breath.     You pass out.   You develop pain, with or without swelling or redness in your leg.   You develop heavy vaginal bleeding, with or without blood clots.   You develop stomach pain.   You develop a bad smelling vaginal discharge.  MAKE SURE YOU:   Understand these instructions.   Will watch your condition.   Will get help right away if you are not doing well or get worse.  Document Released: 01/14/2007 Document Revised: 03/08/2011 Document Reviewed: 01/26/2009 [facility] Patient Information 2012 [place], [place].

## 2011-06-04 NOTE — Progress Notes (Signed)
Post Partum Day 1 Subjective: no complaints, up ad lib, voiding and tolerating PO  Objective: Blood pressure 105/68, pulse 80, temperature 97.7 F (36.5 C), temperature source Oral, resp. rate 16, height 5\' 2"  (1.575 m), weight 195 lb (88.451 kg), SpO2 84.00%, unknown if currently breastfeeding.  Physical Exam:  General: alert, cooperative and no distress Lochia: appropriate Uterine Fundus: firm Incision: n/a DVT Evaluation: No evidence of DVT seen on physical exam. Negative Homan's sign. No cords or calf tenderness.   Basename 06/03/11 1450  HGB 10.9*  HCT 32.4*    Assessment/Plan: Discharge home and Contraception mirena   LOS: 1 day   Kathryn Alvarez H. 06/04/2011, 7:03 AM

## 2011-06-05 LAB — RH IG WORKUP (INCLUDES ABO/RH)
ABO/RH(D): O NEG
Antibody Screen: NEGATIVE
Gestational Age(Wks): 39

## 2011-06-07 ENCOUNTER — Other Ambulatory Visit: Payer: Self-pay

## 2011-07-12 ENCOUNTER — Ambulatory Visit (INDEPENDENT_AMBULATORY_CARE_PROVIDER_SITE_OTHER): Payer: Self-pay | Admitting: Physician Assistant

## 2011-07-12 ENCOUNTER — Encounter: Payer: Self-pay | Admitting: Physician Assistant

## 2011-07-12 NOTE — Progress Notes (Signed)
  Subjective:     Kathryn Alvarez is a 21 y.o. female who presents for a postpartum visit. She is 4 weeks postpartum following a VBAC after 2 c-sections. I have fully reviewed the prenatal and intrapartum course. The delivery was at 41 gestational weeks. Outcome: vaginal birth after cesarean (VBAC). Anesthesia: epidural. Postpartum course has been uncomplicated. Baby's course has been uncomplicated. Baby is feeding by bottle - . Bleeding no bleeding. Bowel function is normal. Bladder function is normal. Patient is sexually active. Contraception method is condoms. Postpartum depression screening: negative. LMP 06/25/2011. Intercourse on Sunday w/o complaint.  The following portions of the patient's history were reviewed and updated as appropriate: allergies, current medications, past family history, past medical history, past social history, past surgical history and problem list.  Review of Systems A comprehensive review of systems was negative.   Objective:    BP 136/90  Pulse 80  Temp(Src) 97 F (36.1 C) (Oral)  Ht 5\' 2"  (1.575 m)  Wt 165 lb 14.4 oz (75.252 kg)  BMI 30.34 kg/m2  Breastfeeding? No  General:  alert, cooperative and no distress   Breasts:  inspection negative, no nipple discharge or bleeding, no masses or nodularity palpable        Assessment:    4 postpartum exam. Pap smear done at today's visit.   Plan:    1. Contraception: IUD 2. Abstinence until IUD insert 3. Follow up in: 2 weeks or as needed. for IUD

## 2011-08-08 ENCOUNTER — Ambulatory Visit (INDEPENDENT_AMBULATORY_CARE_PROVIDER_SITE_OTHER): Payer: Medicaid Other | Admitting: Advanced Practice Midwife

## 2011-08-08 ENCOUNTER — Encounter: Payer: Self-pay | Admitting: Advanced Practice Midwife

## 2011-08-08 VITALS — BP 116/77 | HR 78 | Temp 97.0°F | Ht 61.0 in | Wt 154.6 lb

## 2011-08-08 DIAGNOSIS — Z9189 Other specified personal risk factors, not elsewhere classified: Secondary | ICD-10-CM

## 2011-08-08 DIAGNOSIS — Z98891 History of uterine scar from previous surgery: Secondary | ICD-10-CM

## 2011-08-08 DIAGNOSIS — Z30430 Encounter for insertion of intrauterine contraceptive device: Secondary | ICD-10-CM

## 2011-08-08 DIAGNOSIS — Z01812 Encounter for preprocedural laboratory examination: Secondary | ICD-10-CM

## 2011-08-08 DIAGNOSIS — Z9889 Other specified postprocedural states: Secondary | ICD-10-CM

## 2011-08-08 DIAGNOSIS — Z202 Contact with and (suspected) exposure to infections with a predominantly sexual mode of transmission: Secondary | ICD-10-CM

## 2011-08-08 MED ORDER — LEVONORGESTREL 20 MCG/24HR IU IUD
INTRAUTERINE_SYSTEM | Freq: Once | INTRAUTERINE | Status: AC
  Administered 2011-08-08: 1 via INTRAUTERINE

## 2011-08-08 MED ORDER — LEVONORGESTREL 20 MCG/24HR IU IUD: 1.0000 | INTRAUTERINE_SYSTEM | Freq: Once | INTRAUTERINE | Status: DC

## 2011-08-08 MED ORDER — IBUPROFEN 600 MG PO TABS: 600.0000 mg | ORAL_TABLET | Freq: Four times a day (QID) | ORAL | Status: AC | PRN

## 2011-08-08 NOTE — Patient Instructions (Signed)
Intrauterine Device Insertion Care After Refer to this sheet in the next few weeks. These instructions provide you with information on caring for yourself after your procedure. Your caregiver may also give you more specific instructions. Your treatment has been planned according to current medical practices, but problems sometimes occur. Call your caregiver if you have any problems or questions after your procedure. HOME CARE INSTRUCTIONS   Only take over-the-counter or prescription medicines for pain, discomfort, or fever as directed by your caregiver. Do not use aspirin. This may increase bleeding.   Check your IUD to make sure it is in place before you resume sexual activity. You should be able to feel the strings. If you cannot feel the strings, something may be wrong. The IUD may have fallen out of the uterus, or the uterus may have been punctured (perforated) during placement. Also, if the strings are getting longer, it may mean that the IUD is being forced out of the uterus. You no longer have full protection from pregnancy if any of these problems occur.   You may resume sexual intercourse if you are not having problems with the IUD. The IUD is considered immediately effective.   You may resume normal activities.   Keep all follow-up appointments to be sure your IUD has remained in place. After the first exam, yearly exams are advised, unless you cannot feel the strings of your IUD.   Continue to check that the IUD is still in place by feeling for the strings after every menstrual period.  SEEK MEDICAL CARE IF:   You have bleeding that is heavier or lasts longer than a normal menstrual cycle.   You have a fever.   You have increasing cramps or abdominal pain not relieved with medicine.   You have abdominal pain that does not seem to be related to the same area of earlier cramping and pain.   You are lightheaded, unusually weak, or faint.   You have abnormal vaginal discharge or  smells.   You have pain during sexual intercourse.   You cannot feel the IUD strings, or the IUD string has gotten longer.   You feel the IUD at the opening of the cervix in the vagina.   You think you are pregnant, or you miss your menstrual period.   The IUD string is hurting your sex partner.  Document Released: 11/15/2010 Document Revised: 03/08/2011 Document Reviewed: 11/15/2010 [facility] Patient Information 2012 [place], [place].  Intrauterine Device Insertion Most often, an intrauterine device (IUD) is inserted into the uterus to prevent pregnancy. There are 2 types of IUDs available:  Copper IUD. This type of IUD creates an environment that is not favorable to sperm survival. The mechanism of action of the copper IUD is not known for certain. It can stay in place for 10 years.   Hormone IUD. This type of IUD contains the hormone progestin (synthetic progesterone). The progestin thickens the cervical mucus and prevents sperm from entering the uterus, and it also thins the uterine lining. There is no evidence that the hormone IUD prevents implantation. The hormone IUD can stay in place for up to 5 years.  An IUD is the most cost-effective birth control if left in place for the full duration. It may be removed at any time. LET YOUR CAREGIVER KNOW ABOUT:  Sensitivity to metals.   Medicines taken including herbs, eyedrops, over-the-counter medicines, and creams.   Use of steroids (by mouth or creams).   Previous problems with anesthetics or numbing  medicine.   Previous gynecological surgery.   History of blood clots or clotting disorders.   Possibility of pregnancy.   Menstrual irregularities.   Concerns regarding unusual vaginal discharge or odors.   Previous experience with an IUD.   Other health problems.  RISKS AND COMPLICATIONS  Accidental puncture (perforation) of the uterus.   Accidental placement of the IUD either in the muscle layer of the uterus  (myometrium) or outside the uterus. If this happen, the IUD can be found essentially floating around the bowels. When this happens, the IUD must be taken out surgically.   The IUD may fall out of the uterus (expulsion). This is more common in women who have recently had a child.    Pregnancy in the fallopian tube (ectopic).  BEFORE THE PROCEDURE  Schedule the IUD insertion for when you will have your menstrual period or right after, to make sure you are not pregnant. Placement of the IUD is better tolerated shortly after a menstrual cycle.   You may need to take tests or be examined to make sure you are not pregnant.   You may be required to take a pregnancy test.   You may be required to get checked for sexually transmitted infections (STIs) prior to placement. Placing an IUD in someone who has an infection can make an infection worse.   You may be given a pain reliever to take 1 or 2 hours before the procedure.   An exam will be performed to determine the size and position of your uterus.   Ask your caregiver about changing or stopping your regular medicines.  PROCEDURE   A tool (speculum) is placed in the vagina. This allows your caregiver to see the lower part of the uterus (cervix).   The cervix is prepped with a medicine that lowers the risk of infection.   You may be given a medicine to numb each side of the cervix (intracervical or paracervical block). This is used to block and control any discomfort with insertion.   A tool (uterine sound) is inserted into the uterus to determine the length of the uterine cavity and the direction the uterus may be tilted.   A slim instrument (IUD inserter) is inserted through the cervical canal and into your uterus.   The IUD is placed in the uterine cavity and the insertion device is removed.   The nylon string that is attached to the IUD, and used for eventual IUD removal, is trimmed. It is trimmed so that it lays high in the vagina,  just outside the cervix.  AFTER THE PROCEDURE  You may have bleeding after the procedure. This is normal. It varies from light spotting for a few days to menstrual-like bleeding.   You may have mild cramping.   Practice checking the string coming out of the cervix to make sure the IUD remains in the uterus. If you cannot feel the string, you should schedule a "string check" with your caregiver.   If you had a hormone IUD inserted, expect that your period may be lighter or nonexistent within a year's time (though this is not always the case). There may be delayed fertility with the hormone IUD as a result of its progesterone effect. When you are ready to become pregnant, it is suggested to have the IUD removed up to 1 year in advance.   Yearly exams are advised.  Document Released: 11/15/2010 Document Revised: 03/08/2011 Document Reviewed: 11/15/2010 [facility] Patient Information 2012 [place], [place].

## 2011-08-08 NOTE — Progress Notes (Signed)
IUD Insertion Procedure Note  Pre-operative Diagnosis: undesired fertility  Post-operative Diagnosis: same  Indications: contraception  Procedure Details  Urine pregnancy test was done today and result was negative. GC/CT cultures obtained. No IC x 3 weeks. The risks (including infection, bleeding, pain, and uterine perforation) and benefits of the procedure were explained to the patient and Written informed consent was obtained.    Cervix cleansed with Betadine. Uterus retroverted per bimanual exam. Uterus sounded to 7 cm. IUD inserted without difficulty. String visible and trimmed. Patient tolerated procedure well.  IUD Information: Mirena.  Condition: Stable  Complications: None  Plan:  The patient was advised to call for any fever or for prolonged or severe pain or bleeding. She was advised to use Ibuprofen as needed for mild to moderate pain.   Attending Physician Documentation: I was performed the entire procedure, including opening and closing.  Dorathy Kinsman 08/08/2011 4:45 PM

## 2011-08-09 LAB — GC/CHLAMYDIA PROBE AMP, GENITAL: GC Probe Amp, Genital: NEGATIVE

## 2011-09-19 ENCOUNTER — Ambulatory Visit: Payer: Medicaid Other | Admitting: Advanced Practice Midwife

## 2012-11-09 ENCOUNTER — Emergency Department (HOSPITAL_BASED_OUTPATIENT_CLINIC_OR_DEPARTMENT_OTHER)
Admission: EM | Admit: 2012-11-09 | Discharge: 2012-11-09 | Disposition: A | Discharge status: Home / Self Care | Payer: Self-pay | Attending: Emergency Medicine | Admitting: Emergency Medicine

## 2012-11-09 ENCOUNTER — Encounter (HOSPITAL_BASED_OUTPATIENT_CLINIC_OR_DEPARTMENT_OTHER): Payer: Self-pay | Admitting: Emergency Medicine

## 2012-11-09 DIAGNOSIS — M545 Low back pain, unspecified: Secondary | ICD-10-CM | POA: Insufficient documentation

## 2012-11-09 DIAGNOSIS — Z3202 Encounter for pregnancy test, result negative: Secondary | ICD-10-CM | POA: Insufficient documentation

## 2012-11-09 DIAGNOSIS — N760 Acute vaginitis: Secondary | ICD-10-CM | POA: Insufficient documentation

## 2012-11-09 DIAGNOSIS — N898 Other specified noninflammatory disorders of vagina: Secondary | ICD-10-CM | POA: Insufficient documentation

## 2012-11-09 DIAGNOSIS — N390 Urinary tract infection, site not specified: Secondary | ICD-10-CM | POA: Insufficient documentation

## 2012-11-09 DIAGNOSIS — Z87891 Personal history of nicotine dependence: Secondary | ICD-10-CM | POA: Insufficient documentation

## 2012-11-09 DIAGNOSIS — B9689 Other specified bacterial agents as the cause of diseases classified elsewhere: Secondary | ICD-10-CM | POA: Insufficient documentation

## 2012-11-09 DIAGNOSIS — A499 Bacterial infection, unspecified: Secondary | ICD-10-CM | POA: Insufficient documentation

## 2012-11-09 DIAGNOSIS — R110 Nausea: Secondary | ICD-10-CM | POA: Insufficient documentation

## 2012-11-09 LAB — URINALYSIS, ROUTINE W REFLEX MICROSCOPIC
Glucose, UA: NEGATIVE mg/dL
Nitrite: POSITIVE — AB
Protein, ur: NEGATIVE mg/dL
pH: 6.5 (ref 5.0–8.0)

## 2012-11-09 LAB — WET PREP, GENITAL: Trich, Wet Prep: NONE SEEN

## 2012-11-09 LAB — URINE MICROSCOPIC-ADD ON

## 2012-11-09 LAB — PREGNANCY, URINE: Preg Test, Ur: NEGATIVE

## 2012-11-09 MED ORDER — METRONIDAZOLE 500 MG PO TABS: 500.0000 mg | ORAL_TABLET | Freq: Two times a day (BID) | ORAL | Status: DC

## 2012-11-09 MED ORDER — SULFAMETHOXAZOLE-TRIMETHOPRIM 800-160 MG PO TABS: 1.0000 | ORAL_TABLET | Freq: Two times a day (BID) | ORAL | Status: DC

## 2012-11-09 NOTE — ED Provider Notes (Signed)
CSN: [id number]     Arrival date & time 11/09/12  1454 History    This chart was scribed for Toy Baker, MD by Quintella Reichert, ED scribe.  This patient was seen in room MH09/MH09 and the patient's care was started at 3:33 PM.     Chief Complaint  Patient presents with  . Back Pain  . Abdominal Pain    The history is provided by the patient. No language interpreter was used.    HPI Comments: Kathryn Alvarez is a 22 y.o. female who presents to the Emergency Department complaining of 3 days of constant LLQ abdominal pain with accompanying lower back pain, nausea and mild abnormal vaginal bleeding.  LLQ pain is described as sharp.  Back pain is described as cramping across the bilateral lower back and is exacerbated by standing.  Pain does not radiate.  Pt also complains of some spotting over the last several days.  She denies hematuria, dysuria, vomiting or fever.  She denies prior h/o similar symptoms.  She denies h/o ovarian cysts.  Pt is sexually active and states she always uses barrier protection.  She works as a Child psychotherapist.      Past Medical History  Diagnosis Date  . UTI (lower urinary tract infection) 2012    during 2 pregnancy    Past Surgical History  Procedure Laterality Date  . Cesarean section    . Breast fibroadenoma surgery  2006    bilat breast fibroids    Family History  Problem Relation Age of Onset  . Cancer Mother     cervical  . Asthma Brother   . Cancer Maternal Aunt     cervical  . Cancer Maternal Grandmother   . Diabetes Maternal Grandmother   . Asthma Maternal Grandmother     History  Substance Use Topics  . Smoking status: Former Smoker -- 0.25 packs/day for 4 years  . Smokeless tobacco: Never Used  . Alcohol Use: No    OB History   Grav Para Term Preterm Abortions TAB SAB Ect Mult Living   3 3 3  0 0 0 0 0 0 3       Review of Systems  Constitutional: Negative for fever.  Gastrointestinal: Positive for nausea and abdominal pain.  Negative for vomiting.  Genitourinary: Positive for vaginal bleeding. Negative for dysuria and hematuria.  Musculoskeletal: Positive for back pain.  All other systems reviewed and are negative.      Allergies  Review of patient's allergies indicates no known allergies.  Home Medications  No current outpatient prescriptions on file.  BP 120/75  Pulse 77  Temp(Src) 98.2 F (36.8 C) (Oral)  Resp 18  SpO2 100%  LMP 10/22/2012  Physical Exam  Nursing note and vitals reviewed. Constitutional: She is oriented to person, place, and time. She appears well-developed and well-nourished.  Non-toxic appearance. No distress.  HENT:  Head: Normocephalic and atraumatic.  Eyes: Conjunctivae, EOM and lids are normal. Pupils are equal, round, and reactive to light.  Neck: Normal range of motion. Neck supple. No tracheal deviation present. No mass present.  Cardiovascular: Normal rate, regular rhythm and normal heart sounds.  Exam reveals no gallop.   No murmur heard. Pulmonary/Chest: Effort normal and breath sounds normal. No stridor. No respiratory distress. She has no decreased breath sounds. She has no wheezes. She has no rhonchi. She has no rales.  Abdominal: Soft. Normal appearance and bowel sounds are normal. She exhibits no distension. There is tenderness (Mild) in  the suprapubic area and left lower quadrant. There is no rebound and no CVA tenderness.  Mild tenderness to LLQ and suprapubic area  Genitourinary: No tenderness or bleeding around the vagina. Vaginal discharge found.  Musculoskeletal: Normal range of motion. She exhibits no edema and no tenderness.       Lumbar back: She exhibits tenderness. She exhibits no bony tenderness.  No midline spinal tenderness. Some paraspinal muscle tenderness.  Neurological: She is alert and oriented to person, place, and time. She has normal strength. No cranial nerve deficit or sensory deficit. GCS eye subscore is 4. GCS verbal subscore is 5. GCS  motor subscore is 6.  Skin: Skin is warm and dry. No abrasion and no rash noted.  No rash is appreciated.  Psychiatric: She has a normal mood and affect. Her speech is normal and behavior is normal.    ED Course  Procedures (including critical care time)  DIAGNOSTIC STUDIES: Oxygen Saturation is 100% on room air, normal by my interpretation.    COORDINATION OF CARE: 3:38 PM-Discussed treatment plan which includes wet prep, UA and STD test with pt at bedside and pt agreed to plan.     Labs Reviewed  URINALYSIS, ROUTINE W REFLEX MICROSCOPIC - Abnormal; Notable for the following:    APPearance CLOUDY (*)    Hgb urine dipstick LARGE (*)    Nitrite POSITIVE (*)    Leukocytes, UA MODERATE (*)    All other components within normal limits  URINE MICROSCOPIC-ADD ON - Abnormal; Notable for the following:    Squamous Epithelial / LPF FEW (*)    Bacteria, UA MANY (*)    All other components within normal limits  WET PREP, GENITAL  GC/CHLAMYDIA PROBE AMP  URINE CULTURE  PREGNANCY, URINE   No results found.  No diagnosis found.   MDM  Patient be treated for bacterial vaginosis and early UTI. No concern for ovarian torsion   I personally performed the services described in this documentation, which was scribed in my presence. The recorded information has been reviewed and is accurate.     Toy Baker, MD 11/09/12 [phone]

## 2012-11-09 NOTE — ED Notes (Signed)
Bil low back pain and LLQ pain x3 days.  Nausea but no vomiting.  Pt concerned due to presence of IUD.

## 2012-11-11 LAB — URINE CULTURE

## 2012-11-11 LAB — GC/CHLAMYDIA PROBE AMP: GC Probe RNA: NEGATIVE

## 2012-11-12 NOTE — ED Notes (Signed)
Chart sent to EDP office for review.+ Chlamydia 

## 2012-11-12 NOTE — ED Notes (Signed)
+   Urine Culture Patient treated per protocol MD.  

## 2012-11-13 ENCOUNTER — Telehealth (HOSPITAL_COMMUNITY): Payer: Self-pay | Admitting: *Deleted

## 2012-11-14 NOTE — ED Notes (Addendum)
Rx for Azithromycin 1 gram po x 1 dose called to [facility] [phone] by Marisue Ivan PFM.[facility] faxed

## 2012-11-23 ENCOUNTER — Emergency Department (HOSPITAL_BASED_OUTPATIENT_CLINIC_OR_DEPARTMENT_OTHER)
Admission: EM | Admit: 2012-11-23 | Discharge: 2012-11-23 | Disposition: A | Discharge status: Home / Self Care | Payer: Self-pay | Attending: Emergency Medicine | Admitting: Emergency Medicine

## 2012-11-23 ENCOUNTER — Encounter (HOSPITAL_BASED_OUTPATIENT_CLINIC_OR_DEPARTMENT_OTHER): Payer: Self-pay | Admitting: *Deleted

## 2012-11-23 DIAGNOSIS — Z87891 Personal history of nicotine dependence: Secondary | ICD-10-CM | POA: Insufficient documentation

## 2012-11-23 DIAGNOSIS — B9689 Other specified bacterial agents as the cause of diseases classified elsewhere: Secondary | ICD-10-CM | POA: Insufficient documentation

## 2012-11-23 DIAGNOSIS — N760 Acute vaginitis: Secondary | ICD-10-CM | POA: Insufficient documentation

## 2012-11-23 DIAGNOSIS — N898 Other specified noninflammatory disorders of vagina: Secondary | ICD-10-CM | POA: Insufficient documentation

## 2012-11-23 DIAGNOSIS — Z79899 Other long term (current) drug therapy: Secondary | ICD-10-CM | POA: Insufficient documentation

## 2012-11-23 DIAGNOSIS — A499 Bacterial infection, unspecified: Secondary | ICD-10-CM | POA: Insufficient documentation

## 2012-11-23 DIAGNOSIS — N390 Urinary tract infection, site not specified: Secondary | ICD-10-CM | POA: Insufficient documentation

## 2012-11-23 DIAGNOSIS — R300 Dysuria: Secondary | ICD-10-CM | POA: Insufficient documentation

## 2012-11-23 DIAGNOSIS — Z3202 Encounter for pregnancy test, result negative: Secondary | ICD-10-CM | POA: Insufficient documentation

## 2012-11-23 LAB — PREGNANCY, URINE: Preg Test, Ur: NEGATIVE

## 2012-11-23 LAB — URINALYSIS, ROUTINE W REFLEX MICROSCOPIC
Glucose, UA: NEGATIVE mg/dL
Ketones, ur: NEGATIVE mg/dL
Nitrite: POSITIVE — AB
Specific Gravity, Urine: 1.024 (ref 1.005–1.030)
pH: 6.5 (ref 5.0–8.0)

## 2012-11-23 LAB — WET PREP, GENITAL: Yeast Wet Prep HPF POC: NONE SEEN

## 2012-11-23 LAB — URINE MICROSCOPIC-ADD ON

## 2012-11-23 MED ORDER — CIPROFLOXACIN HCL 500 MG PO TABS: 500.0000 mg | ORAL_TABLET | Freq: Two times a day (BID) | ORAL | Status: DC

## 2012-11-23 MED ORDER — METRONIDAZOLE 500 MG PO TABS: 500.0000 mg | ORAL_TABLET | Freq: Two times a day (BID) | ORAL | Status: DC

## 2012-11-23 NOTE — ED Provider Notes (Signed)
CSN: [id number]     Arrival date & time 11/23/12  1455 History     First MD Initiated Contact with Patient 11/23/12 1510     Chief Complaint  Patient presents with  . Hematuria   (Consider location/radiation/quality/duration/timing/severity/associated sxs/prior Treatment) HPI Comments: Patient is a 22 year old female who presents with a 1 day history of dysuria. Symptoms started suddenly this morning and remained constant. Patient reports a burning sensation when she urinates. She reports associated blood in her urine, vaginal bleeding and vaginal discharge. Patient's LMP was 8/18 and is currently menstruating. No aggravating/alleviating factors. No other associated symptoms.    Past Medical History  Diagnosis Date  . UTI (lower urinary tract infection) 2012    during 2 pregnancy   Past Surgical History  Procedure Laterality Date  . Cesarean section    . Breast fibroadenoma surgery  2006    bilat breast fibroids   Family History  Problem Relation Age of Onset  . Cancer Mother     cervical  . Asthma Brother   . Cancer Maternal Aunt     cervical  . Cancer Maternal Grandmother   . Diabetes Maternal Grandmother   . Asthma Maternal Grandmother    History  Substance Use Topics  . Smoking status: Former Smoker -- 0.25 packs/day for 4 years  . Smokeless tobacco: Never Used  . Alcohol Use: No   OB History   Grav Para Term Preterm Abortions TAB SAB Ect Mult Living   3 3 3  0 0 0 0 0 0 3     Review of Systems  Genitourinary: Positive for dysuria, hematuria and vaginal bleeding.  All other systems reviewed and are negative.    Allergies  Review of patient's allergies indicates no known allergies.  Home Medications   Current Outpatient Rx  Name  Route  Sig  Dispense  Refill  . metroNIDAZOLE (FLAGYL) 500 MG tablet   Oral   Take 1 tablet (500 mg total) by mouth 2 (two) times daily.   14 tablet   0   . sulfamethoxazole-trimethoprim (SEPTRA DS) 800-160 MG per tablet  Oral   Take 1 tablet by mouth every 12 (twelve) hours.   10 tablet   0    BP 123/84  Pulse 75  Temp(Src) 97.8 F (36.6 C) (Oral)  Resp 18  Ht 5\' 2"  (1.575 m)  Wt 141 lb (63.957 kg)  BMI 25.78 kg/m2  SpO2 100%  LMP 11/22/2012 Physical Exam  Nursing note and vitals reviewed. Constitutional: She is oriented to person, place, and time. She appears well-developed and well-nourished. No distress.  HENT:  Head: Normocephalic and atraumatic.  Eyes: Conjunctivae are normal.  Cardiovascular: Normal rate and regular rhythm.  Exam reveals no gallop and no friction rub.   No murmur heard. Pulmonary/Chest: Effort normal and breath sounds normal. She has no wheezes. She has no rales. She exhibits no tenderness.  Abdominal: Soft. There is no tenderness.  Genitourinary:  Normal external genitalia. Copious blood in vagina. Cervix parted. No CMT. Generalized tenderness to palpation on bimanual exam. No masses palpated.   Musculoskeletal: Normal range of motion.  Neurological: She is alert and oriented to person, place, and time. Coordination normal.  Speech is goal-oriented. Moves limbs without ataxia.   Skin: Skin is warm and dry.  Psychiatric: She has a normal mood and affect. Her behavior is normal.    ED Course   Procedures (including critical care time)  Labs Reviewed  WET PREP, GENITAL -  Abnormal; Notable for the following:    Clue Cells Wet Prep HPF POC FEW (*)    WBC, Wet Prep HPF POC FEW (*)    All other components within normal limits  URINALYSIS, ROUTINE W REFLEX MICROSCOPIC - Abnormal; Notable for the following:    APPearance CLOUDY (*)    Hgb urine dipstick MODERATE (*)    Nitrite POSITIVE (*)    Leukocytes, UA MODERATE (*)    All other components within normal limits  URINE MICROSCOPIC-ADD ON - Abnormal; Notable for the following:    Bacteria, UA MANY (*)    All other components within normal limits  URINE CULTURE  GC/CHLAMYDIA PROBE AMP  PREGNANCY, URINE   No  results found.  1. UTI (urinary tract infection)   2. BV (bacterial vaginosis)     MDM  3:10 PM Urinalysis, urine pregnancy pending. Patient will have a pelvic exam. Vitals stable and patient afebrile.    4:16 PM Patient has a UTI and BV based on urinalysis and wet prep. Patient will be discharged with Metronidazole and Cipro for infections. No further evaluation needed at this time.   Emilia Beck, PA-C 11/23/12 1620

## 2012-11-23 NOTE — ED Notes (Signed)
Patient here with c/o yeast infections symptoms with blood in urine. Patient LMP was 8/23.  Patient c/o burning sensation when she urinates.

## 2012-11-23 NOTE — ED Notes (Signed)
The pelvic cart is set up at the bedside and ready for the doctor.

## 2012-11-26 LAB — URINE CULTURE

## 2012-11-27 NOTE — ED Provider Notes (Signed)
Medical screening examination/treatment/procedure(s) were performed by non-physician practitioner and as supervising physician I was immediately available for consultation/collaboration.  Raeford Razor, MD 11/27/12 [phone]

## 2012-11-27 NOTE — ED Notes (Signed)
+   Urine Patient treated with Cipro-sensitive to same-chart appended per protocol MD. 

## 2013-08-08 ENCOUNTER — Encounter (HOSPITAL_BASED_OUTPATIENT_CLINIC_OR_DEPARTMENT_OTHER): Payer: Self-pay | Admitting: Emergency Medicine

## 2013-08-08 ENCOUNTER — Emergency Department (HOSPITAL_BASED_OUTPATIENT_CLINIC_OR_DEPARTMENT_OTHER)
Admission: EM | Admit: 2013-08-08 | Discharge: 2013-08-08 | Disposition: A | Discharge status: Home / Self Care | Payer: No Typology Code available for payment source | Attending: Emergency Medicine | Admitting: Emergency Medicine

## 2013-08-08 DIAGNOSIS — Z79899 Other long term (current) drug therapy: Secondary | ICD-10-CM | POA: Insufficient documentation

## 2013-08-08 DIAGNOSIS — J069 Acute upper respiratory infection, unspecified: Secondary | ICD-10-CM | POA: Insufficient documentation

## 2013-08-08 DIAGNOSIS — F17200 Nicotine dependence, unspecified, uncomplicated: Secondary | ICD-10-CM | POA: Insufficient documentation

## 2013-08-08 DIAGNOSIS — Z87440 Personal history of urinary (tract) infections: Secondary | ICD-10-CM | POA: Insufficient documentation

## 2013-08-08 DIAGNOSIS — J029 Acute pharyngitis, unspecified: Secondary | ICD-10-CM | POA: Insufficient documentation

## 2013-08-08 LAB — RAPID STREP SCREEN (MED CTR MEBANE ONLY): Streptococcus, Group A Screen (Direct): NEGATIVE

## 2013-08-08 MED ORDER — IBUPROFEN 800 MG PO TABS
800.0000 mg | ORAL_TABLET | Freq: Once | ORAL | Status: AC
  Administered 2013-08-08: 800 mg via ORAL
  Filled 2013-08-08: qty 1

## 2013-08-08 MED ORDER — IBUPROFEN 800 MG PO TABS: 800.0000 mg | ORAL_TABLET | Freq: Three times a day (TID) | ORAL | Status: DC

## 2013-08-08 NOTE — ED Notes (Signed)
Head congestion, ear pain with swallowing, and cough x2 days.

## 2013-08-08 NOTE — Discharge Instructions (Signed)
Cool Mist Vaporizers °Vaporizers may help relieve the symptoms of a cough and cold. They add moisture to the air, which helps mucus to become thinner and less sticky. This makes it easier to breathe and cough up secretions. Cool mist vaporizers do not cause serious burns like hot mist vaporizers ("steamers, humidifiers"). Vaporizers have not been proved to show they help with colds. You should not use a vaporizer if you are allergic to mold.  °HOME CARE INSTRUCTIONS °· Follow the package instructions for the vaporizer. °· Do not use anything other than distilled water in the vaporizer. °· Do not run the vaporizer all of the time. This can cause mold or bacteria to grow in the vaporizer. °· Clean the vaporizer after each time it is used. °· Clean and dry the vaporizer well before storing it. °· Stop using the vaporizer if worsening respiratory symptoms develop. °Document Released: 12/15/2003 Document Revised: 11/19/2012 Document Reviewed: 08/06/2012 °[employer]® Patient Information ©2014 [employer]. ° °

## 2013-08-08 NOTE — ED Provider Notes (Signed)
CSN: [id number]     Arrival date & time 08/08/13  0148 History   First MD Initiated Contact with Patient 08/08/13 0159     Chief Complaint  Patient presents with  . Nasal Congestion     (Consider location/radiation/quality/duration/timing/severity/associated sxs/prior Treatment) Patient is a 23 y.o. female presenting with URI. The history is provided by the patient.  URI Presenting symptoms: congestion, rhinorrhea and sore throat   Presenting symptoms: no cough and no fever   Congestion:    Location:  Nasal Rhinorrhea:    Quality:  Clear   Severity:  Mild   Timing:  Constant   Progression:  Unchanged Sore throat:    Severity:  Moderate   Onset quality:  Gradual   Timing:  Constant   Progression:  Unchanged Severity:  Moderate Onset quality:  Gradual Timing:  Constant Progression:  Unchanged Chronicity:  New Relieved by:  Nothing Worsened by:  Nothing tried Associated symptoms: no arthralgias, no neck pain, no sinus pain, no swollen glands and no wheezing   Risk factors: not elderly and no recent illness     Past Medical History  Diagnosis Date  . UTI (lower urinary tract infection) 2012    during 2 pregnancy   Past Surgical History  Procedure Laterality Date  . Cesarean section    . Breast fibroadenoma surgery  2006    bilat breast fibroids   Family History  Problem Relation Age of Onset  . Cancer Mother     cervical  . Asthma Brother   . Cancer Maternal Aunt     cervical  . Cancer Maternal Grandmother   . Diabetes Maternal Grandmother   . Asthma Maternal Grandmother    History  Substance Use Topics  . Smoking status: Current Every Day Smoker -- 0.50 packs/day for 4 years  . Smokeless tobacco: Never Used  . Alcohol Use: No   OB History   Grav Para Term Preterm Abortions TAB SAB Ect Mult Living   3 3 3  0 0 0 0 0 0 3     Review of Systems  Constitutional: Negative for fever.  HENT: Positive for congestion, rhinorrhea and sore throat. Negative for  drooling, trouble swallowing and voice change.   Respiratory: Negative for cough and wheezing.   Musculoskeletal: Negative for arthralgias and neck pain.  All other systems reviewed and are negative.     Allergies  Review of patient's allergies indicates no known allergies.  Home Medications   Prior to Admission medications   Medication Sig Start Date End Date Taking? Authorizing Provider  ciprofloxacin (CIPRO) 500 MG tablet Take 1 tablet (500 mg total) by mouth 2 (two) times daily. 11/23/12   Kaitlyn Szekalski, PA-C  ibuprofen (ADVIL,MOTRIN) 800 MG tablet Take 1 tablet (800 mg total) by mouth 3 (three) times daily. 08/08/13   Reighan Hipolito K Andreu Drudge-Rasch, MD  metroNIDAZOLE (FLAGYL) 500 MG tablet Take 1 tablet (500 mg total) by mouth 2 (two) times daily. 11/09/12   Toy BakerAnthony T Allen, MD  metroNIDAZOLE (FLAGYL) 500 MG tablet Take 1 tablet (500 mg total) by mouth 2 (two) times daily. 11/23/12   Kaitlyn Szekalski, PA-C  sulfamethoxazole-trimethoprim (SEPTRA DS) 800-160 MG per tablet Take 1 tablet by mouth every 12 (twelve) hours. 11/09/12   Toy BakerAnthony T Allen, MD   BP 116/68  Pulse 118  Temp(Src) 98.9 F (37.2 C) (Oral)  Resp 18  Ht 5\' 1"  (1.549 m)  Wt 151 lb (68.493 kg)  BMI 28.55 kg/m2  SpO2 98%  LMP 08/03/2013  Physical Exam  Constitutional: She is oriented to person, place, and time. She appears well-developed and well-nourished. No distress.  HENT:  Head: Normocephalic and atraumatic.  Right Ear: External ear normal.  Left Ear: External ear normal.  Mouth/Throat: Oropharynx is clear and moist. No oropharyngeal exudate.  Eyes: Conjunctivae are normal. Pupils are equal, round, and reactive to light.  Neck: Normal range of motion. Neck supple.  No pain with displacement of the trachea  Cardiovascular: Normal rate, regular rhythm and intact distal pulses.   Pulmonary/Chest: Effort normal and breath sounds normal. No stridor. No respiratory distress. She has no wheezes. She has no rales.   Abdominal: Soft. Bowel sounds are normal. There is no tenderness. There is no rebound and no guarding.  Musculoskeletal: Normal range of motion.  Lymphadenopathy:    She has no cervical adenopathy.  Neurological: She is alert and oriented to person, place, and time.  Skin: Skin is warm and dry.  Psychiatric: She has a normal mood and affect.    ED Course  Procedures (including critical care time) Labs Review Labs Reviewed  RAPID STREP SCREEN  CULTURE, GROUP A STREP    Imaging Review No results found.   EKG Interpretation None      MDM   Final diagnoses:  URI (upper respiratory infection)    Well appearing, symptoms really only started 2 hours ago.  Based on Cent[facility] criteria no indication for treatment.  Recommend flonas for nasal congestion and ibuprofen for pain.  Return for change in voice inability to swallow shortness of breath or any concerns    Revel Stellmach K Tevin Shillingford-Rasch, MD 08/08/13 [phone]

## 2013-08-10 LAB — CULTURE, GROUP A STREP

## 2013-10-07 ENCOUNTER — Encounter (HOSPITAL_BASED_OUTPATIENT_CLINIC_OR_DEPARTMENT_OTHER): Payer: Self-pay | Admitting: Emergency Medicine

## 2013-10-07 ENCOUNTER — Emergency Department (HOSPITAL_BASED_OUTPATIENT_CLINIC_OR_DEPARTMENT_OTHER)
Admission: EM | Admit: 2013-10-07 | Discharge: 2013-10-08 | Disposition: A | Discharge status: Home / Self Care | Payer: No Typology Code available for payment source | Attending: Emergency Medicine | Admitting: Emergency Medicine

## 2013-10-07 DIAGNOSIS — Z87440 Personal history of urinary (tract) infections: Secondary | ICD-10-CM | POA: Insufficient documentation

## 2013-10-07 DIAGNOSIS — Z792 Long term (current) use of antibiotics: Secondary | ICD-10-CM | POA: Insufficient documentation

## 2013-10-07 DIAGNOSIS — N12 Tubulo-interstitial nephritis, not specified as acute or chronic: Secondary | ICD-10-CM | POA: Insufficient documentation

## 2013-10-07 DIAGNOSIS — F17200 Nicotine dependence, unspecified, uncomplicated: Secondary | ICD-10-CM | POA: Insufficient documentation

## 2013-10-07 DIAGNOSIS — Z3202 Encounter for pregnancy test, result negative: Secondary | ICD-10-CM | POA: Insufficient documentation

## 2013-10-07 DIAGNOSIS — Z9889 Other specified postprocedural states: Secondary | ICD-10-CM | POA: Insufficient documentation

## 2013-10-07 LAB — URINALYSIS, ROUTINE W REFLEX MICROSCOPIC
Bilirubin Urine: NEGATIVE
Glucose, UA: NEGATIVE mg/dL
KETONES UR: NEGATIVE mg/dL
NITRITE: POSITIVE — AB
PH: 6 (ref 5.0–8.0)
PROTEIN: 30 mg/dL — AB
Specific Gravity, Urine: 1.02 (ref 1.005–1.030)
Urobilinogen, UA: 1 mg/dL (ref 0.0–1.0)

## 2013-10-07 LAB — URINE MICROSCOPIC-ADD ON

## 2013-10-07 LAB — PREGNANCY, URINE: Preg Test, Ur: NEGATIVE

## 2013-10-07 MED ORDER — KETOROLAC TROMETHAMINE 30 MG/ML IJ SOLN
30.0000 mg | Freq: Once | INTRAMUSCULAR | Status: AC
  Administered 2013-10-07: 30 mg via INTRAVENOUS
  Filled 2013-10-07: qty 1

## 2013-10-07 MED ORDER — SODIUM CHLORIDE 0.9 % IV BOLUS (SEPSIS)
1000.0000 mL | Freq: Once | INTRAVENOUS | Status: AC
  Administered 2013-10-07: 1000 mL via INTRAVENOUS

## 2013-10-07 MED ORDER — CEFTRIAXONE SODIUM 1 G IJ SOLR
INTRAMUSCULAR | Status: AC
  Filled 2013-10-07: qty 10

## 2013-10-07 MED ORDER — DEXTROSE 5 % IV SOLN
1.0000 g | Freq: Once | INTRAVENOUS | Status: AC
  Administered 2013-10-07: 1 g via INTRAVENOUS

## 2013-10-07 NOTE — ED Notes (Signed)
MD at bedside. 

## 2013-10-07 NOTE — ED Provider Notes (Signed)
CSN: [id number]     Arrival date & time 10/07/13  2125 History  This chart was scribed for Loren Raceravid Lacresha Fusilier, MD by Nicholos Johnsenise Iheanachor, ED scribe. This patient was seen in room MH04/MH04 and the patient's care was started at 11:09 PM.   Chief Complaint  Patient presents with  . Abdominal Pain    The history is provided by the patient. No language interpreter was used.   HPI Comments: Kathryn Alvarez is a 23 y.o. female w/ hx of kidney infection presents to the Emergency Department complaining of left sided flank pain and LUQ abdominal pain; onset 1 day prior. Associated frequency, urgency, nausea, and chills. Denies current nausea. Has not taken any medication at home for pain. Reports similar pain during prior pregnancy; not pregnant currently. Denies vomiting, dysuria, vaginal discharge, vaginal bleeding, or fever.  Past Medical History  Diagnosis Date  . UTI (lower urinary tract infection) 2012    during 2 pregnancy   Past Surgical History  Procedure Laterality Date  . Cesarean section    . Breast fibroadenoma surgery  2006    bilat breast fibroids   Family History  Problem Relation Age of Onset  . Cancer Mother     cervical  . Asthma Brother   . Cancer Maternal Aunt     cervical  . Cancer Maternal Grandmother   . Diabetes Maternal Grandmother   . Asthma Maternal Grandmother    History  Substance Use Topics  . Smoking status: Current Every Day Smoker -- 0.50 packs/day for 4 years  . Smokeless tobacco: Never Used  . Alcohol Use: No   OB History   Grav Para Term Preterm Abortions TAB SAB Ect Mult Living   3 3 3  0 0 0 0 0 0 3     Review of Systems  Constitutional: Positive for chills. Negative for fever.  Gastrointestinal: Positive for nausea and abdominal pain. Negative for vomiting and diarrhea.  Genitourinary: Positive for urgency, frequency and flank pain. Negative for dysuria, vaginal bleeding and vaginal discharge.  Musculoskeletal: Positive for back pain. Negative  for myalgias.  Neurological: Negative for dizziness, weakness, numbness and headaches.  All other systems reviewed and are negative.  Allergies  Review of patient's allergies indicates no known allergies.  Home Medications   Prior to Admission medications   Medication Sig Start Date End Date Taking? Authorizing Provider  ciprofloxacin (CIPRO) 500 MG tablet Take 1 tablet (500 mg total) by mouth 2 (two) times daily. 11/23/12   Kaitlyn Szekalski, PA-C  ibuprofen (ADVIL,MOTRIN) 800 MG tablet Take 1 tablet (800 mg total) by mouth 3 (three) times daily. 08/08/13   April K Palumbo-Rasch, MD  metroNIDAZOLE (FLAGYL) 500 MG tablet Take 1 tablet (500 mg total) by mouth 2 (two) times daily. 11/09/12   Toy BakerAnthony T Allen, MD  metroNIDAZOLE (FLAGYL) 500 MG tablet Take 1 tablet (500 mg total) by mouth 2 (two) times daily. 11/23/12   Kaitlyn Szekalski, PA-C  sulfamethoxazole-trimethoprim (SEPTRA DS) 800-160 MG per tablet Take 1 tablet by mouth every 12 (twelve) hours. 11/09/12   Toy BakerAnthony T Allen, MD   Triage vitals: BP 114/70  Pulse 99  Temp(Src) 98.4 F (36.9 C) (Oral)  Resp 18  Ht 5\' 1"  (1.549 m)  Wt 151 lb (68.493 kg)  BMI 28.55 kg/m2  SpO2 99%  LMP 09/24/2013  Physical Exam  Nursing note and vitals reviewed. Constitutional: Kathryn Alvarez is oriented to person, place, and time. Kathryn Alvarez appears well-developed and well-nourished. No distress.  HENT:  Head: Normocephalic  and atraumatic.  Mouth/Throat: Oropharynx is clear and moist.  Eyes: Conjunctivae and EOM are normal. Pupils are equal, round, and reactive to light.  Neck: Normal range of motion. Neck supple. No tracheal deviation present.  Cardiovascular: Normal rate and regular rhythm.   Pulmonary/Chest: Effort normal and breath sounds normal. No respiratory distress. Kathryn Alvarez has no wheezes. Kathryn Alvarez has no rales.  Abdominal: Soft. Bowel sounds are normal. Kathryn Alvarez exhibits no distension and no mass. There is tenderness (mild left upper quadrant tenderness to deep palpation).  There is no rebound and no guarding.  Musculoskeletal: Normal range of motion. Kathryn Alvarez exhibits tenderness. Kathryn Alvarez exhibits no edema.  Left CVA tenderness.  Neurological: Kathryn Alvarez is alert and oriented to person, place, and time.  Moves all extremities without deficit. Sensation is grossly intact  Skin: Skin is warm and dry. No rash noted. No erythema.  Psychiatric: Kathryn Alvarez has a normal mood and affect. Her behavior is normal.    ED Course  Procedures (including critical care time) DIAGNOSTIC STUDIES: Oxygen Saturation is 99% on room air, normal by my interpretation.    COORDINATION OF CARE: At 10:52 PM: Discussed treatment plan with patient which includes IV fluids and antibiotics. Patient agrees.   Labs Review Results for orders placed during the hospital encounter of 10/07/13  URINALYSIS, ROUTINE W REFLEX MICROSCOPIC      Result Value Ref Range   Color, Urine YELLOW  YELLOW   APPearance TURBID (*) CLEAR   Specific Gravity, Urine 1.020  1.005 - 1.030   pH 6.0  5.0 - 8.0   Glucose, UA NEGATIVE  NEGATIVE mg/dL   Hgb urine dipstick MODERATE (*) NEGATIVE   Bilirubin Urine NEGATIVE  NEGATIVE   Ketones, ur NEGATIVE  NEGATIVE mg/dL   Protein, ur 30 (*) NEGATIVE mg/dL   Urobilinogen, UA 1.0  0.0 - 1.0 mg/dL   Nitrite POSITIVE (*) NEGATIVE   Leukocytes, UA LARGE (*) NEGATIVE  PREGNANCY, URINE      Result Value Ref Range   Preg Test, Ur NEGATIVE  NEGATIVE  URINE MICROSCOPIC-ADD ON      Result Value Ref Range   Squamous Epithelial / LPF MANY (*) RARE   WBC, UA TOO NUMEROUS TO COUNT  <3 WBC/hpf   RBC / HPF 21-50  <3 RBC/hpf   Bacteria, UA MANY (*) RARE   Urine-Other MUCOUS PRESENT     Imaging Review No results found.   EKG Interpretation None      MDM   Final diagnoses:  None    I personally performed the services described in this documentation, which was scribed in my presence. The recorded information has been reviewed and is accurate.  Patient states Kathryn Alvarez is feeling better after  medication and fluids. Given IV Rocephin in the emergency department. Will be discharged home with Cipro. Kathryn Alvarez's been given return precautions and is voiced understanding.   Loren Raceravid Keonia Pasko, MD 10/08/13 [phone]

## 2013-10-07 NOTE — ED Notes (Signed)
C/o abd pain and left flank pain x 2 days

## 2013-10-08 MED ORDER — ONDANSETRON 4 MG PO TBDP
ORAL_TABLET | ORAL | Status: DC
Start: 2013-10-08 — End: 2014-10-13

## 2013-10-08 MED ORDER — IBUPROFEN 600 MG PO TABS: 600.0000 mg | ORAL_TABLET | Freq: Four times a day (QID) | ORAL | Status: DC | PRN

## 2013-10-08 MED ORDER — HYDROCODONE-ACETAMINOPHEN 5-325 MG PO TABS: 2.0000 | ORAL_TABLET | ORAL | Status: DC | PRN

## 2013-10-08 MED ORDER — CIPROFLOXACIN HCL 500 MG PO TABS: 500.0000 mg | ORAL_TABLET | Freq: Two times a day (BID) | ORAL | Status: DC

## 2013-10-08 NOTE — Discharge Instructions (Signed)
Pyelonephritis, Adult °Pyelonephritis is a kidney infection. A kidney infection can happen quickly, or it can last for a long time. °HOME CARE  °· Take your medicine (antibiotics) as told. Finish it even if you start to feel better. °· Keep all doctor visits as told. °· Drink enough fluids to keep your pee (urine) clear or pale yellow. °· Only take medicine as told by your doctor. °GET HELP RIGHT AWAY IF:  °· You have a fever or lasting symptoms for more than 2-3 days. °· You have a fever and your symptoms suddenly get worse. °· You cannot take your medicine or drink fluids as told. °· You have chills and shaking. °· You feel very weak or pass out (faint). °· You do not feel better after 2 days. °MAKE SURE YOU: °· Understand these instructions. °· Will watch your condition. °· Will get help right away if you are not doing well or get worse. °Document Released: 04/26/2004 Document Revised: 09/18/2011 Document Reviewed: 09/06/2010 °[employer]® Patient Information ©2015 [employer]. This information is not intended to replace advice given to you by your health care provider. Make sure you discuss any questions you have with your health care provider. ° °

## 2014-02-01 ENCOUNTER — Encounter (HOSPITAL_BASED_OUTPATIENT_CLINIC_OR_DEPARTMENT_OTHER): Payer: Self-pay | Admitting: Emergency Medicine

## 2014-10-12 ENCOUNTER — Emergency Department (HOSPITAL_COMMUNITY)
Admission: EM | Admit: 2014-10-12 | Discharge: 2014-10-13 | Disposition: A | Discharge status: Home / Self Care | Payer: No Typology Code available for payment source | Attending: Emergency Medicine | Admitting: Emergency Medicine

## 2014-10-12 ENCOUNTER — Encounter (HOSPITAL_COMMUNITY): Payer: Self-pay | Admitting: Emergency Medicine

## 2014-10-12 DIAGNOSIS — M25511 Pain in right shoulder: Secondary | ICD-10-CM | POA: Insufficient documentation

## 2014-10-12 DIAGNOSIS — Z87891 Personal history of nicotine dependence: Secondary | ICD-10-CM | POA: Insufficient documentation

## 2014-10-12 DIAGNOSIS — M542 Cervicalgia: Secondary | ICD-10-CM | POA: Insufficient documentation

## 2014-10-12 NOTE — ED Notes (Signed)
Onset one week ago woke up with right neck, right shoulder, upper back, with radiating pain to right arm. Seen chiropractor with no relief. Pain currently 9/10 achy tingling pain.

## 2014-10-13 MED ORDER — CYCLOBENZAPRINE HCL 10 MG PO TABS: 10.0000 mg | ORAL_TABLET | Freq: Two times a day (BID) | ORAL | Status: AC | PRN

## 2014-10-13 NOTE — Discharge Instructions (Signed)
Please monitor for new or worsening signs or symptoms. Please return immediately if any present. Please contact orthopedic specialist for further evaluation and management if symptoms persist. Please continue using ibuprofen, ice, heat.

## 2014-10-13 NOTE — ED Notes (Signed)
Pt. Left with all belongings and refused wheelchair 

## 2014-10-13 NOTE — ED Provider Notes (Signed)
CSN: [id number]     Arrival date & time 10/12/14  2322 History   First MD Initiated Contact with Patient 10/12/14 2355     Chief Complaint  Patient presents with  . Numbness    HPI   41 YOF presents today with right neck, shoulder and arm pain. Pt notes that 1 week ago she woke up with a sore neck and shoulder; also notes "numbness' in the left palmar aspect of the distal 1st and 2nd digits. She reports symptoms have been persistent since initial onset with the only relief when using ice. She reports using ibuprofen with no improvement. Pain is worse with left lateral flexion of the neck. She states that she saw a chiropractor with no improvement. Reduced strength of extremity due to pain. Denies fever, chills, trauma, weight loss, malignancy or any other concerning signs or symptoms.    Past Medical History  Diagnosis Date  . UTI (lower urinary tract infection) 2012    during 2 pregnancy   Past Surgical History  Procedure Laterality Date  . Cesarean section    . Breast fibroadenoma surgery  2006    bilat breast fibroids   Family History  Problem Relation Age of Onset  . Cancer Mother     cervical  . Asthma Brother   . Cancer Maternal Aunt     cervical  . Cancer Maternal Grandmother   . Diabetes Maternal Grandmother   . Asthma Maternal Grandmother    History  Substance Use Topics  . Smoking status: Former Smoker -- 0.00 packs/day for 4 years  . Smokeless tobacco: Never Used  . Alcohol Use: No   OB History    Gravida Para Term Preterm AB TAB SAB Ectopic Multiple Living   0 0 0 0 0 0 3     Review of Systems  All other systems reviewed and are negative.   Allergies  Review of patient's allergies indicates no known allergies.  Home Medications   Prior to Admission medications   Medication Sig Start Date End Date Taking? Authorizing Provider  ibuprofen (ADVIL,MOTRIN) 200 MG tablet Take 400 mg by mouth every 6 (six) hours as needed for moderate pain.   Yes  Historical Provider, MD  cyclobenzaprine (FLEXERIL) 10 MG tablet Take 1 tablet (10 mg total) by mouth 2 (two) times daily as needed for muscle spasms. 10/13/14   Vickie Melnik, PA-C   BP 120/78 mmHg  Pulse 99  Temp(Src) 98.2 F (36.8 C) (Oral)  Resp 12  Ht  (1.575 m)  Wt 136 lb (61.689 kg)  BMI 24.87 kg/m2  SpO2 99%    Physical Exam  Constitutional: She is oriented to person, place, and time. She appears well-developed and well-nourished.  HENT:  Head: Normocephalic and atraumatic.  Eyes: Conjunctivae are normal. Pupils are equal, round, and reactive to light. Right eye exhibits no discharge. Left eye exhibits no discharge. No scleral icterus.  Neck: Normal range of motion. No JVD present. No tracheal deviation present.  Full active range of motion, no pain with compression, no obvious deformities, abscess, swelling, signs of infection  Pulmonary/Chest: Effort normal. No stridor.  Musculoskeletal:  Pain with palpation of the right paracervical muscles, trapezius, shoulder. Patient has full passive range of motion with pain. 90 of flexion active. Sensation intact distally with the exception of the fingertips digit 1 and 2 palmar aspect only. Grip strength 5 out of 5, Radial pulses 2+   Neurological: She is alert and oriented to  person, place, and time. Coordination normal.  Psychiatric: She has a normal mood and affect. Her behavior is normal. Judgment and thought content normal.  Nursing note and vitals reviewed.   ED Course  Procedures (including critical care time) Labs Review Labs Reviewed - No data to display  Imaging Review No results found.   EKG Interpretation None      MDM   Final diagnoses:  Right shoulder pain  Neck pain    Labs:  Imaging:  Consults:  Therapeutics:  Assessment:  Plan: Patient presents with right shoulder neck pain times one week. No significant mechanism of injury, reports symptoms have been stable since then with no  worsening. Patient denies decreased strength in the extremity, does have focal numbness in the fingertips of digits 1 and 2 of the right extremity, grip strength 5 out of 5. No signs of infection of the shoulder. Patient does experience some relief of symptoms with ice. Patient will be discharged home with Flexeril, instructions to continue using ibuprofen, ice, heat, follow-up with orthopedic for further evaluation and management. Return precautions given. Patient verbalized understanding and agreement to today's plan and had no further questions or concerns.        Eyvonne MechanicJeffrey Troyce Gieske, PA-C 10/13/14 [id number]  Marisa Severinlga Otter, MD 10/13/14 [phone]

## 2014-12-17 ENCOUNTER — Encounter (HOSPITAL_COMMUNITY): Payer: Self-pay | Admitting: Emergency Medicine

## 2014-12-17 ENCOUNTER — Emergency Department (HOSPITAL_COMMUNITY)
Admission: EM | Admit: 2014-12-17 | Discharge: 2014-12-17 | Disposition: A | Discharge status: Home / Self Care | Payer: No Typology Code available for payment source | Attending: Emergency Medicine | Admitting: Emergency Medicine

## 2014-12-17 DIAGNOSIS — R21 Rash and other nonspecific skin eruption: Secondary | ICD-10-CM | POA: Insufficient documentation

## 2014-12-17 DIAGNOSIS — Z720 Tobacco use: Secondary | ICD-10-CM | POA: Insufficient documentation

## 2014-12-17 DIAGNOSIS — Z87440 Personal history of urinary (tract) infections: Secondary | ICD-10-CM | POA: Insufficient documentation

## 2014-12-17 MED ORDER — DIPHENHYDRAMINE HCL 25 MG PO TABS: 25.0000 mg | ORAL_TABLET | Freq: Four times a day (QID) | ORAL | Status: AC | PRN

## 2014-12-17 MED ORDER — PREDNISONE 10 MG (21) PO TBPK: 10.0000 mg | ORAL_TABLET | Freq: Every day | ORAL | Status: DC

## 2014-12-17 MED ORDER — ACETAMINOPHEN 500 MG PO TABS
1000.0000 mg | ORAL_TABLET | Freq: Once | ORAL | Status: AC
  Administered 2014-12-17: 1000 mg via ORAL
  Filled 2014-12-17: qty 2

## 2014-12-17 MED ORDER — HYDROCORTISONE 1 % EX CREA: TOPICAL_CREAM | CUTANEOUS | Status: DC

## 2014-12-17 NOTE — Discharge Instructions (Signed)
Read the information below.  Use the prescribed medication as directed.  Please discuss all new medications with your pharmacist.  You may return to the Emergency Department at any time for worsening condition or any new symptoms that concern you.  If you develop redness, swelling, pus draining from the wound, or fevers greater than 100.4, return to the ER immediately for a recheck.   ° ° °Rash °A rash is a change in the color or texture of your skin. There are many different types of rashes. You may have other problems that accompany your rash. °CAUSES  °· Infections. °· Allergic reactions. This can include allergies to pets or foods. °· Certain medicines. °· Exposure to certain chemicals, soaps, or cosmetics. °· Heat. °· Exposure to poisonous plants. °· Tumors, both cancerous and noncancerous. °SYMPTOMS  °· Redness. °· Scaly skin. °· Itchy skin. °· Dry or cracked skin. °· Bumps. °· Blisters. °· Pain. °DIAGNOSIS  °Your caregiver may do a physical exam to determine what type of rash you have. A skin sample (biopsy) may be taken and examined under a microscope. °TREATMENT  °Treatment depends on the type of rash you have. Your caregiver may prescribe certain medicines. For serious conditions, you may need to see a skin doctor (dermatologist). °HOME CARE INSTRUCTIONS  °· Avoid the substance that caused your rash. °· Do not scratch your rash. This can cause infection. °· You may take cool baths to help stop itching. °· Only take over-the-counter or prescription medicines as directed by your caregiver. °· Keep all follow-up appointments as directed by your caregiver. °SEEK IMMEDIATE MEDICAL CARE IF: °· You have increasing pain, swelling, or redness. °· You have a fever. °· You have new or severe symptoms. °· You have body aches, diarrhea, or vomiting. °· Your rash is not better after 3 days. °MAKE SURE YOU: °· Understand these instructions. °· Will watch your condition. °· Will get help right away if you are not doing  well or get worse. °Document Released: 03/09/2002 Document Revised: 06/11/2011 Document Reviewed: 01/01/2011 °ExitCare® Patient Information ©2015 [employer]. This information is not intended to replace advice given to you by your health care provider. Make sure you discuss any questions you have with your health care provider. ° ° ° °Emergency Department Resource Guide °1) Find a Doctor and Pay Out of Pocket °Although you won't have to find out who is covered by your insurance plan, it is a good idea to ask around and get recommendations. You will then need to call the office and see if the doctor you have chosen will accept you as a new patient and what types of options they offer for patients who are self-pay. Some doctors offer discounts or will set up payment plans for their patients who do not have insurance, but you will need to ask so you aren't surprised when you get to your appointment. ° °2) Contact Your Local Health Department °Not all health departments have doctors that can see patients for sick visits, but many do, so it is worth a call to see if yours does. If you don't know where your local health department is, you can check in your phone book. The CDC also has a tool to help you locate your state's health department, and many state websites also have listings of all of their local health departments. ° °3) Find a Walk-in Clinic °If your illness is not likely to be very severe or complicated, you may want to try a walk in   clinic. These are popping up all over the country in pharmacies, drugstores, and shopping centers. They're usually staffed by nurse practitioners or physician assistants that have been trained to treat common illnesses and complaints. They're usually fairly quick and inexpensive. However, if you have serious medical issues or chronic medical problems, these are probably not your best option. ° °No Primary Care Doctor: °- Call [facility] at  832-8000 - they can help you  locate a primary care doctor that  accepts your insurance, provides certain services, etc. °- Physician Referral Service- 1-800-533-3463 ° °Chronic Pain Problems: °Organization         Address  Phone   Notes  °[facility]  (336) 297-2271 Patients need to be referred by their primary care doctor.  ° °Medication Assistance: °Organization         Address  Phone   Notes  °[place] [facility] [address] °[place], [place] [address] (336) 641-8030 --Must be a resident of [place] °-- Must have NO insurance coverage whatsoever (no Medicaid/ Medicare, etc.) °-- The pt. MUST have a primary care doctor that directs their care regularly and follows them in the community °  °[facility]  (866) 331-1348   °[employer]  (888) 892-1162   ° °Agencies that provide inexpensive medical care: °Organization         Address  Phone   Notes  °[facility]  (336) 832-8035   °[facility]    (336) 832-7272   °[facility] [address] °[place], [place] [address] (336) 832-4777   °[facility] [address], °[place] (336) 271-4999   °[facility]    (336) 373-0678   °[facility]    (336) 272-1050   °[facility] ° [address], [place] Phone:  (336) 832-4444, Fax:  (336) 832-4440 Hours of Operation:  9 am - 6 pm, M-F.  Also accepts Medicaid/Medicare and self-pay.  ° [facility] ° [address], [place] Phone: (336) 832-3150, Fax: (336) 832-3151. Hours of Operation:  8:30 am - 5:30 pm, M-F.  Also accepts Medicaid and self-pay.  °[facility] [place] [address], [place] Phone: (336) 878-6027   °[facility] [address], [place], [place] (336)723-1848, Ext. 123 Mondays & Thursdays: 7-9 AM.  First 15 patients are seen on a first come, first serve basis. °  ° °Medicaid-accepting [place]  Providers: ° °Organization         Address  Phone   Notes  °[facility] [address], [place] (336) 641-2100 Also accepts self-pay patients.  °[facility] [address], [place] ° (336) 856-9996   °[facility] [address], [place] (336) 288-8857   °[facility] [address] [place] [address], [place] (336) 299-7000   °Veita Bland [address], [place]  ° (336) 373-1557 Only accepts [place] Access Medicaid patients after they have their name applied to their card.  ° °Self-Pay (no insurance) in [place]: ° °Organization         Address  Phone   Notes  °Sickle Cell Patients, [facility] [address],  (336) 832-1970   °[facility] [address],  (336) 832-4400   °[facility] [place] ° [address], [place] (336) 992-4800   °[facility]/Dr. Osei-Bonsu ° [address] [place] [address], [place] or [address], [place] (336) 841-8500 Phone number for both [place] and [place] locations is the same.  °[facility] [address], [place] (336) 299-0000   °[facility] [address] [place] [address], [place] or [address] (336) 852-7530 °(336) 878-2260   °[facility] [address], [place] (336) 350-1642, phone; (336) 294-5005, fax Sees patients 1st and 3rd Saturday of every month.  Must not qualify for public or private insurance (i.e. Medicaid, Medicare, [place] Health Choice, Veterans' Benefits) • Household income should be no more than 200% of the poverty level •The clinic cannot treat you if you are pregnant or think you are pregnant • Sexually transmitted diseases are not treated at the clinic.  ° ° °Dental Care: °Organization         Address  Phone  Notes  °[facility] [address], [place] (336) 641-6152 Accepts children up to age 21 who are enrolled in Medicaid or [place] Health Choice; pregnant women with a Medicaid card; and children who have applied for Medicaid or [place] Health Choice, but were declined, whose parents can pay a reduced fee at time of service.  °[facility] [place]  [address], [place] (336) 641-7733 Accepts children up to age 21 who are enrolled in Medicaid or [place] Health Choice; pregnant women with a Medicaid card; and children who have applied for Medicaid or [place] Health Choice, but were declined, whose parents can pay a reduced fee at time of service.  °[facility] ° [address],  (336) 641-4533 Patients are seen by appointment only. Walk-ins are not accepted. [facility] will see patients 18 years of age and older. °Monday - Tuesday (8am-5pm) °Most Wednesdays (8:30-5pm) °$30 per visit, cash only  °[facility] ° [address], [place] (336) 641-4533 Patients are seen by appointment only. Walk-ins are not accepted. [facility] will see patients 18 years of age and older. °One Wednesday Evening (Monthly: Volunteer Based).  $30 per visit, cash only  °[facility]  (919) 537-3737 for adults; Children under age 4, call Graduate Pediatric Dentistry at (919) [phone]. Children aged 4-14, please call (919) [phone] to request a pediatric application. ° Dental services are provided in all areas of dental care including fillings, crowns and bridges, complete and partial dentures, implants, gum treatment, root canals, and extractions. Preventive care is also provided. Treatment is provided to both adults and children. °Patients are selected via a lottery and there is often a waiting list. °  °[facility] [address], ° ° (336) 763-8833 [email or web address] °  °[facility]  [address], [place], [place] (336)723-1848, Ext. 123 Second and Fourth Thursday of each month, opens at 6:30 AM; Clinic ends at 9 AM.  Patients are seen on a first-come first-served basis, and a limited number are seen during each clinic.  ° °[facility] ° [address], [place], [place] (336) 723-7904   Eligibility Requirements °You must have lived in [place], [place], or [place] counties for at least the last three months. °  You cannot be eligible for state or federal sponsored healthcare insurance, including   Veterans Administration, Medicaid, or Medicare. °  You generally cannot be eligible for healthcare insurance through your employer.  °  How to apply: °Eligibility screenings are held every Tuesday and Wednesday afternoon from 1:00 pm until 4:00 pm. You do not need an appointment for the interview!  °[facility] [address], [place], [place] 336-631-2330   °[facility]  336-342-8273   °[facility]  336-703-3100   °[facility]  336-570-6415   ° °Behavioral Health Resources in the Community: °Intensive Outpatient Programs °Organization         Address  Phone  Notes  °[facility] [address], [place], [place] 336-878-6098   °[facility] [address], [place], [place] 336-832-9800   °[facility] [address], [place], [place] ° 336-882-2125   °[facility] [address],  °[place], [place] 1-800-853-5163 or 336-641-4981   °Substance Abuse Resources °Organization         Address  Phone  Notes  °[facility]  336-882-2125   °[facility]  336-784-9470   °[facility]  336-285-9073   °[facility]  336-845-3988   °Residential & Outpatient Substance Abuse Program  1-800-659-3381   °Psychological Services °Organization         Address  Phone  Notes  °[facility]  336- 832-9600     °[facility]  336- 378-7881   °[facility] [address], [place] 1-800-853-5163 or 336-641-4981   ° °Mobile Crisis Teams °Organization         Address  Phone  Notes  °[facility], Mobile Crisis Care Unit  1-877-626-1772   °Assertive °[facility] ° [address] [place], [place] 336-834-9664   °Sharon DeEsch [address] °[place] 336-554-5454   ° °Self-Help/Support Groups °Organization         Address  Phone             Notes  °[facility] - variety of support groups  336- 373-1402 Call for more information  °Narcotics Anonymous (NA), [facility] [address], °[place]  2 meetings at this location  ° °Residential Treatment Programs °Organization         Address  Phone  Notes  °[facility] [address],    °[place]  1-866-801-8205   °[facility] ° [address], [place], [place] 704-293-8524   °[facility] [address], [place] 336-845-3988 Admissions: 8am-3pm M-F  °[facility] [address]    °[place], [place] 336-841-1104   °[facility] [address], [place], [place] 336-379-7146   °[facility] [address]  °[place], [place] 336-285-9073   °[facility] Intensive Outpatient [address], [place], [place] 336-852-3033   °[facility] [address]  °[place], [place] 1-877-615-2722 or 336-784-9470   °[facility] [address] [place], [place] 336-227-7417 Accepts Medicaid  °[facility] [address]  ° [place] 1-800-659-3381 Substance Abuse/Addiction Treatment  ° °[place] Behavioral Health Resources °Organization         Address  Phone  Notes  °[facility]  (888) 581-9988   °Julie Brannon, PhD [address] [place],    (336) 349-5553 or (336) 951-0000    °  [facility]   [address] °[place], [place] (336) 349-4454   °[facility] [address], [place], [place] (336) 342-8316 Insurance/Medicaid/sponsorship through [facility]  °[facility] [address]                                    [place], [place] (336) 342-8316 Therapy/tele-psych/case  °[facility] [address]  ° [place], [place] (336) 349-2233    °Dr. Arfeen  (336) 349-4544   °[facility] [place]  [employer] [place] [facility] 1) [address], [place] °2) [address], [place] °3)  [address], [place] (336) 349-3220 °(336) 342-7768 ° °(336) 342-8140   °[place] [facility] (336) 342-1394 or (336) 342-3537 (After Hours)    ° ° ° °

## 2014-12-17 NOTE — ED Provider Notes (Signed)
CSN: [id number]     Arrival date & time 12/17/14  1030 History   First MD Initiated Contact with Patient 12/17/14 1104     Chief Complaint  Patient presents with  . Abrasion     The history is provided by the patient. No language interpreter was used.    HPI Comments: Kathryn Alvarez is a 24 y.o. female who presents to the Emergency Department complaining of raised, red, itchy, non painful, rash onset 1.5 weeks. There is a large red, scabbed patch to her right lower back that she states is gradually getting bigger. She also notes rash over her entire body. Pt endorses that she has had this issue in the past but has never gotten this large and it is always in the same area.  Pt has used eczema cream and other healing ointment with no relief. She has no known cause for her symptoms. She denies any new detergents, poison ivy, or animal exposure. Pt admits to using new body wash. She has followed up with a dermatologist in the past when she was a child but was unable to find the cause. She denies any fever, cough, nausea, vomiting or diarrhea.  Currently has a headache but states this is a typical headache for her, no complicating factors.     Past Medical History  Diagnosis Date  . UTI (lower urinary tract infection) 2012    during 2 pregnancy   Past Surgical History  Procedure Laterality Date  . Cesarean section    . Breast fibroadenoma surgery  2006    bilat breast fibroids   Family History  Problem Relation Age of Onset  . Cancer Mother     cervical  . Asthma Brother   . Cancer Maternal Aunt     cervical  . Cancer Maternal Grandmother   . Diabetes Maternal Grandmother   . Asthma Maternal Grandmother    Social History  Substance Use Topics  . Smoking status: Current Every Day Smoker -- 0.00 packs/day for 4 years  . Smokeless tobacco: Never Used  . Alcohol Use: No   OB History    Gravida Para Term Preterm AB TAB SAB Ectopic Multiple Living   0 0 0 0 0 0 3     Review  of Systems  Constitutional: Negative for fever and chills.  HENT: Negative for sore throat and trouble swallowing.   Respiratory: Negative for shortness of breath, wheezing and stridor.   Musculoskeletal: Negative for myalgias, arthralgias, neck pain and neck stiffness.  Skin: Positive for rash.  Allergic/Immunologic: Negative for immunocompromised state.  Neurological: Negative for weakness and numbness.  Hematological: Does not bruise/bleed easily.  Psychiatric/Behavioral: Negative for self-injury.      Allergies  Review of patient's allergies indicates no known allergies.  Home Medications   Prior to Admission medications   Medication Sig Start Date End Date Taking? Authorizing Provider  cyclobenzaprine (FLEXERIL) 10 MG tablet Take 1 tablet (10 mg total) by mouth 2 (two) times daily as needed for muscle spasms. 10/13/14   Eyvonne Mechanic, PA-C  ibuprofen (ADVIL,MOTRIN) 200 MG tablet Take 400 mg by mouth every 6 (six) hours as needed for moderate pain.    Historical Provider, MD   Triage Vitals: BP 110/74 mmHg  Pulse 78  Temp(Src) 97.9 F (36.6 C) (Oral)  Resp 16  Ht  (1.575 m)  Wt 136 lb (61.689 kg)  BMI 24.87 kg/m2  SpO2 99%  LMP 12/14/2014 (Exact Date)  Physical Exam  Constitutional: She appears well-developed and well-nourished. No distress.  HENT:  Head: Normocephalic and atraumatic.  Mouth/Throat: Uvula is midline and oropharynx is clear and moist.  Neck: Neck supple.  Pulmonary/Chest: Effort normal.  Neurological: She is alert.  Skin: Skin is warm. Rash noted. Rash is papular. She is not diaphoretic.  Sparse, excoriated papules, diffusely over body. Right lower back with dry plaque with overlying scale. Non tender, no warmth, no discharge throughout. Please see photo below for details.  Nursing note and vitals reviewed.     ED Course  Procedures (including critical care time)  DIAGNOSTIC STUDIES: Oxygen Saturation is 99% on RA, normal by my  interpretation.    COORDINATION OF CARE: 11:30 AM- Will order Tylenol for HA mgmt and d/c with rx for prednisone, hydrocortisone cream and Benadryl.     Labs Review Labs Reviewed - No data to display  Imaging Review No results found. I have personally reviewed and evaluated these images and lab results as part of my medical decision-making.   EKG Interpretation None      MDM   Final diagnoses:  Rash and nonspecific skin eruption    Afebrile, nontoxic patient with recurrent pruritic rash.  No airway concerns.  Unclear etiology.  Pt has large patch on her right lower back that she apparently has had intermittently with this throughout her life.  No e/o superinfection.   D/C home with prednisone, benadryl, hydrocortisone cream, PCP follow up, dermatology referral.   Discussed result, findings, treatment, and follow up  with patient.  Pt given return precautions.  Pt verbalizes understanding and agrees with plan.       I personally performed the services described in this documentation, which was scribed in my presence. The recorded information has been reviewed and is accurate.    Trixie Dredge, PA-C 12/17/14 1152  Margarita Grizzle, MD 12/17/14 [id number]

## 2014-12-17 NOTE — ED Notes (Signed)
Pt c/o itching red, rash x 1.5 weeks. States her children do NOT have it. Has large "patch" on right posterior hip.

## 2015-01-07 ENCOUNTER — Ambulatory Visit: Payer: No Typology Code available for payment source | Admitting: Family Medicine

## 2015-02-17 ENCOUNTER — Encounter (HOSPITAL_BASED_OUTPATIENT_CLINIC_OR_DEPARTMENT_OTHER): Payer: Self-pay

## 2015-02-17 ENCOUNTER — Emergency Department (HOSPITAL_BASED_OUTPATIENT_CLINIC_OR_DEPARTMENT_OTHER)
Admission: EM | Admit: 2015-02-17 | Discharge: 2015-02-17 | Disposition: A | Discharge status: Home / Self Care | Payer: No Typology Code available for payment source | Attending: Emergency Medicine | Admitting: Emergency Medicine

## 2015-02-17 DIAGNOSIS — F17200 Nicotine dependence, unspecified, uncomplicated: Secondary | ICD-10-CM | POA: Insufficient documentation

## 2015-02-17 DIAGNOSIS — R21 Rash and other nonspecific skin eruption: Secondary | ICD-10-CM | POA: Insufficient documentation

## 2015-02-17 DIAGNOSIS — Z87440 Personal history of urinary (tract) infections: Secondary | ICD-10-CM | POA: Insufficient documentation

## 2015-02-17 MED ORDER — TRIAMCINOLONE ACETONIDE 0.1 % EX CREA: 1.0000 "application " | TOPICAL_CREAM | Freq: Four times a day (QID) | CUTANEOUS | Status: AC | PRN

## 2015-02-17 NOTE — ED Notes (Signed)
Pt reports itchy rash to left arm, back, bilateral knees for a month a half. [facility] and placed on Prednisone a month ago. Denies recent travel, denies contact with known source.

## 2015-02-17 NOTE — ED Provider Notes (Signed)
CSN: [id number]     Arrival date & time 02/17/15  0920 History   First MD Initiated Contact with Patient 02/17/15 0932     Chief Complaint  Patient presents with  . Rash     (Consider location/radiation/quality/duration/timing/severity/associated sxs/prior Treatment) HPI  10 year-year-old female presents with a worsening rash. Primarily the rashes in her right lower back. Over the last 3-4 weeks she has started having a similar but significant smaller rash to her left arm and bilateral knees. Went to [facility] was placed on prednisone but only took 3 days' worth because she started breaking out into hives. Is not taking any other oral medicines when this happened. Has not tried the hydrocortisone cream because she has that at home. Has tried some calamine lotion for the itching. Denies fevers or recent travel. The rash never really went away since her visit in September. Did not follow-up with dermatology because she does not think she has enough money and does not have insurance.  Past Medical History  Diagnosis Date  . UTI (lower urinary tract infection) 2012    during 2 pregnancy   Past Surgical History  Procedure Laterality Date  . Cesarean section    . Breast fibroadenoma surgery  2006    bilat breast fibroids   Family History  Problem Relation Age of Onset  . Cancer Mother     cervical  . Asthma Brother   . Cancer Maternal Aunt     cervical  . Cancer Maternal Grandmother   . Diabetes Maternal Grandmother   . Asthma Maternal Grandmother    Social History  Substance Use Topics  . Smoking status: Current Every Day Smoker -- 0.00 packs/day for 4 years  . Smokeless tobacco: Never Used  . Alcohol Use: No   OB History    Gravida Para Term Preterm AB TAB SAB Ectopic Multiple Living   0 0 0 0 0 0 3     Review of Systems  Constitutional: Negative for fever.  Skin: Positive for rash.  All other systems reviewed and are negative.     Allergies   Prednisone  Home Medications   Prior to Admission medications   Medication Sig Start Date End Date Taking? Authorizing Provider  cyclobenzaprine (FLEXERIL) 10 MG tablet Take 1 tablet (10 mg total) by mouth 2 (two) times daily as needed for muscle spasms. 10/13/14   Eyvonne Mechanic, PA-C  diphenhydrAMINE (BENADRYL) 25 MG tablet Take 1 tablet (25 mg total) by mouth every 6 (six) hours as needed for itching. 12/17/14   Trixie Dredge, PA-C  ibuprofen (ADVIL,MOTRIN) 200 MG tablet Take 400 mg by mouth every 6 (six) hours as needed for moderate pain.    Historical Provider, MD  triamcinolone cream (KENALOG) 0.1 % Apply 1 application topically 4 (four) times daily as needed. 02/17/15   Pricilla Loveless, MD   BP 113/76 mmHg  Pulse 90  Temp(Src) 98.5 F (36.9 C) (Oral)  Resp 14  Ht  (1.575 m)  Wt 136 lb (61.689 kg)  BMI 24.87 kg/m2  SpO2 99%  LMP 02/08/2015 Physical Exam  Constitutional: She is oriented to person, place, and time. She appears well-developed and well-nourished.  HENT:  Head: Normocephalic and atraumatic.  Right Ear: External ear normal.  Left Ear: External ear normal.  Nose: Nose normal.  Eyes: Right eye exhibits no discharge. Left eye exhibits no discharge.  Cardiovascular: Regular rhythm.   Pulmonary/Chest: Effort normal.  Abdominal: She exhibits no distension.  Neurological: She is alert and oriented to person, place, and time.  Skin: Skin is warm and dry. Rash noted.     Excoriated papules/plaque to right lower back. Smaller papules over left arm and bilateral lower shins. Appears quite dry. No overlying cellulitis  Nursing note and vitals reviewed.   ED Course  Procedures (including critical care time) Labs Review Labs Reviewed - No data to display  Imaging Review No results found. I have personally reviewed and evaluated these images and lab results as part of my medical decision-making.   EKG Interpretation None      MDM   Final diagnoses:  Rash  and nonspecific skin eruption    Rash appears similar but more extensive than when she was seen here a few months ago. No concerning findings such as fever, signs of cellulitis, or systemic disease. She states she cannot tolerate prednisone. Will place her on triamcinolone cream given the dryness aspect almost like an eczema. However I encouraged her that she really needs to see a skin specialist have encouraged her to follow-up with the dermatologist. Unclear exactly what is causing this, does not seem to be an allergic reaction or drug rash.    Pricilla LovelessScott Edona Schreffler, MD 02/17/15 [phone]

## 2015-03-14 ENCOUNTER — Telehealth: Payer: Self-pay | Admitting: *Deleted

## 2015-03-14 NOTE — Telephone Encounter (Signed)
Patient is not a patient of the clinics. Patient advised to contact ED and see if prescribing doctor can be sent a message. Patient advised to call back in January to be placed on the NP list. Patient had no further questions for the office and expressed her understanding.

## 2023-06-24 ENCOUNTER — Encounter (HOSPITAL_BASED_OUTPATIENT_CLINIC_OR_DEPARTMENT_OTHER): Payer: Self-pay | Admitting: Emergency Medicine

## 2023-06-24 ENCOUNTER — Other Ambulatory Visit: Payer: Self-pay

## 2023-06-24 DIAGNOSIS — M7918 Myalgia, other site: Secondary | ICD-10-CM | POA: Insufficient documentation

## 2023-06-24 DIAGNOSIS — R112 Nausea with vomiting, unspecified: Secondary | ICD-10-CM | POA: Diagnosis not present

## 2023-06-24 DIAGNOSIS — J029 Acute pharyngitis, unspecified: Secondary | ICD-10-CM | POA: Diagnosis not present

## 2023-06-24 DIAGNOSIS — R197 Diarrhea, unspecified: Secondary | ICD-10-CM | POA: Insufficient documentation

## 2023-06-24 DIAGNOSIS — R0982 Postnasal drip: Secondary | ICD-10-CM | POA: Diagnosis not present

## 2023-06-24 DIAGNOSIS — F17210 Nicotine dependence, cigarettes, uncomplicated: Secondary | ICD-10-CM | POA: Diagnosis not present

## 2023-06-24 DIAGNOSIS — R0789 Other chest pain: Secondary | ICD-10-CM | POA: Diagnosis present

## 2023-06-24 DIAGNOSIS — R059 Cough, unspecified: Secondary | ICD-10-CM | POA: Diagnosis not present

## 2023-06-24 LAB — CBC
HCT: 37.9 % (ref 36.0–46.0)
Hemoglobin: 13.2 g/dL (ref 12.0–15.0)
MCH: 31.9 pg (ref 26.0–34.0)
MCHC: 34.8 g/dL (ref 30.0–36.0)
MCV: 91.5 fL (ref 80.0–100.0)
Platelets: 248 10*3/uL (ref 150–400)
RBC: 4.14 MIL/uL (ref 3.87–5.11)
RDW: 12.8 % (ref 11.5–15.5)
WBC: 5.4 10*3/uL (ref 4.0–10.5)
nRBC: 0 % (ref 0.0–0.2)

## 2023-06-24 LAB — RESP PANEL BY RT-PCR (RSV, FLU A&B, COVID)  RVPGX2
Influenza A by PCR: NEGATIVE
Influenza B by PCR: NEGATIVE
Resp Syncytial Virus by PCR: NEGATIVE
SARS Coronavirus 2 by RT PCR: NEGATIVE

## 2023-06-24 LAB — COMPREHENSIVE METABOLIC PANEL
ALT: 28 U/L (ref 0–44)
AST: 21 U/L (ref 15–41)
Albumin: 3.7 g/dL (ref 3.5–5.0)
Alkaline Phosphatase: 57 U/L (ref 38–126)
Anion gap: 9 (ref 5–15)
BUN: 17 mg/dL (ref 6–20)
CO2: 22 mmol/L (ref 22–32)
Calcium: 8.4 mg/dL — ABNORMAL LOW (ref 8.9–10.3)
Chloride: 105 mmol/L (ref 98–111)
Creatinine, Ser: 0.66 mg/dL (ref 0.44–1.00)
GFR, Estimated: >60 mL/min (ref >60)
Glucose, Bld: 105 mg/dL — ABNORMAL HIGH (ref 70–99)
Potassium: 3.7 mmol/L (ref 3.5–5.1)
Sodium: 136 mmol/L (ref 135–145)
Total Bilirubin: 0.4 mg/dL (ref 0.0–1.2)
Total Protein: 6.8 g/dL (ref 6.5–8.1)

## 2023-06-24 LAB — LIPASE, BLOOD: Lipase: 24 U/L (ref 11–51)

## 2023-06-24 LAB — GROUP A STREP BY PCR: Group A Strep by PCR: NOT DETECTED

## 2023-06-24 LAB — TROPONIN I (HIGH SENSITIVITY): Troponin I (High Sensitivity): <2 ng/L (ref <18)

## 2023-06-24 MED ORDER — ONDANSETRON 4 MG PO TBDP
4.0000 mg | ORAL_TABLET | Freq: Once | ORAL | Status: AC | PRN
  Administered 2023-06-24: 4 mg via ORAL
  Filled 2023-06-24: qty 1

## 2023-06-24 NOTE — ED Triage Notes (Signed)
 Pt POV steady gait- c/o diarrhea, emesis, sore throat, chest congestion, chills, poor po intake x2 days.

## 2023-06-25 ENCOUNTER — Emergency Department (HOSPITAL_BASED_OUTPATIENT_CLINIC_OR_DEPARTMENT_OTHER)

## 2023-06-25 ENCOUNTER — Emergency Department (HOSPITAL_BASED_OUTPATIENT_CLINIC_OR_DEPARTMENT_OTHER)
Admission: EM | Admit: 2023-06-25 | Discharge: 2023-06-25 | Disposition: A | Discharge status: Home / Self Care | Attending: Emergency Medicine | Admitting: Emergency Medicine

## 2023-06-25 DIAGNOSIS — R112 Nausea with vomiting, unspecified: Secondary | ICD-10-CM

## 2023-06-25 DIAGNOSIS — R0789 Other chest pain: Secondary | ICD-10-CM

## 2023-06-25 MED ORDER — IBUPROFEN 600 MG PO TABS: 600.0000 mg | ORAL_TABLET | Freq: Four times a day (QID) | ORAL | 0 refills | Status: AC | PRN

## 2023-06-25 MED ORDER — OXYMETAZOLINE HCL 0.05 % NA SOLN: 1.0000 | Freq: Two times a day (BID) | NASAL | 0 refills | Status: AC

## 2023-06-25 MED ORDER — KETOROLAC TROMETHAMINE 15 MG/ML IJ SOLN
15.0000 mg | Freq: Once | INTRAMUSCULAR | Status: AC
  Administered 2023-06-25: 15 mg via INTRAMUSCULAR
  Filled 2023-06-25: qty 1

## 2023-06-25 MED ORDER — ACETAMINOPHEN 325 MG PO TABS: 650.0000 mg | ORAL_TABLET | Freq: Four times a day (QID) | ORAL | 0 refills | Status: AC | PRN

## 2023-06-25 MED ORDER — ONDANSETRON HCL 4 MG PO TABS: 4.0000 mg | ORAL_TABLET | Freq: Three times a day (TID) | ORAL | 0 refills | Status: AC | PRN

## 2023-06-25 MED ORDER — ONDANSETRON 4 MG PO TBDP
4.0000 mg | ORAL_TABLET | Freq: Once | ORAL | Status: AC
  Administered 2023-06-25: 4 mg via ORAL
  Filled 2023-06-25: qty 1

## 2023-06-25 MED ORDER — GUAIFENESIN-DM 100-10 MG/5ML PO SYRP
5.0000 mL | ORAL_SOLUTION | ORAL | 0 refills | Status: AC | PRN
Start: 2023-06-25 — End: ?

## 2023-06-25 MED ORDER — SUCRALFATE 1 G PO TABS: 1.0000 g | ORAL_TABLET | Freq: Three times a day (TID) | ORAL | 0 refills | Status: AC

## 2023-06-25 NOTE — Discharge Instructions (Signed)
 You should return to the hospital if you experience return of persistent nausea and vomiting that does not resolve and does not allow you to tolerate any food or fluids, persistent fevers for greater than 2-3 more days, increasing abdominal pain that persists despite medications, persistent diarrhea, dizziness, syncope (fainting), or for any other concerns.    Please return to the emergency department immediately for any new or concerning symptoms, or if you get worse.

## 2023-06-25 NOTE — ED Provider Notes (Signed)
 [place] [facility] Provider Note  CSN: [id number] Arrival date & time: 06/24/23 2109  Chief Complaint(s) URI and Abdominal Pain  HPI Kathryn Alvarez is a 33 y.o. female with past medical history as below, significant for C-section who presents to the ED with complaint of nausea, vomiting, diarrhea, postnasal drip, cough, sore throat  Symptoms ongoing around 2 days, nausea vomiting diarrhea, body aches, cough, postnasal drip.  No fevers or chills.  No rashes.  Having some chest pain associate with her coughing.  No significant dyspnea.  Tried Tylenol at home which provides mild relief of her symptoms.  No suspicious p.o. intake, no recent travel.  No BRBPR or melena, emesis nonbloody nonbilious  Past Medical History Past Medical History:  Diagnosis Date   UTI (lower urinary tract infection) 2012   during 2 pregnancy   Patient Active Problem List   Diagnosis Date Noted   H/O cesarean section 08/08/2011   Encounter for insertion of Mirena IUD 08/08/2011   Home Medication(s) Prior to Admission medications   Medication Sig Start Date End Date Taking? Authorizing Provider  acetaminophen (TYLENOL) 325 MG tablet Take 2 tablets (650 mg total) by mouth every 6 (six) hours as needed. 06/25/23  Yes Tanda Rockers A, DO  guaiFENesin-dextromethorphan (ROBITUSSIN DM) 100-10 MG/5ML syrup Take 5 mLs by mouth every 4 (four) hours as needed for cough. 06/25/23  Yes Tanda Rockers A, DO  ibuprofen (ADVIL) 600 MG tablet Take 1 tablet (600 mg total) by mouth every 6 (six) hours as needed. 06/25/23  Yes Tanda Rockers A, DO  ondansetron (ZOFRAN) 4 MG tablet Take 1 tablet (4 mg total) by mouth every 8 (eight) hours as needed for nausea or vomiting. 06/25/23  Yes Tanda Rockers A, DO  oxymetazoline (AFRIN NASAL SPRAY) 0.05 % nasal spray Place 1 spray into both nostrils 2 (two) times daily for 3 days. 06/25/23 06/28/23 Yes Tanda Rockers A, DO  sucralfate (CARAFATE) 1 g tablet Take 1  tablet (1 g total) by mouth with breakfast, with lunch, and with evening meal for 7 days. 06/25/23 07/02/23 Yes Sloan Leiter, DO  cyclobenzaprine (FLEXERIL) 10 MG tablet Take 1 tablet (10 mg total) by mouth 2 (two) times daily as needed for muscle spasms. 10/13/14   Hedges, Tinnie Gens, PA-C  diphenhydrAMINE (BENADRYL) 25 MG tablet Take 1 tablet (25 mg total) by mouth every 6 (six) hours as needed for itching. 12/17/14   Trixie Dredge, PA-C  triamcinolone cream (KENALOG) 0.1 % Apply 1 application topically 4 (four) times daily as needed. 02/17/15   Pricilla Loveless, MD                                                                                                                                    Past Surgical History Past Surgical History:  Procedure Laterality Date   BREAST FIBROADENOMA SURGERY  2006   bilat breast fibroids  CESAREAN SECTION     Family History Family History  Problem Relation Age of Onset   Cancer Mother        cervical   Asthma Brother    Cancer Maternal Aunt        cervical   Cancer Maternal Grandmother    Diabetes Maternal Grandmother    Asthma Maternal Grandmother     Social History Social History   Tobacco Use   Smoking status: Every Day    Current packs/day: 0.00    Types: Cigarettes   Smokeless tobacco: Never  Substance Use Topics   Alcohol use: No   Drug use: No   Allergies Prednisone  Review of Systems A thorough review of systems was obtained and all systems are negative except as noted in the HPI and PMH.   Physical Exam Vital Signs  I have reviewed the triage vital signs BP 111/74 (BP Location: Right Arm)   Pulse 84   Temp 98.4 F (36.9 C) (Oral)   Resp 16   Ht 5\' 1"  (1.549 m)   Wt 94.4 kg   LMP 06/20/2023 (Exact Date)   SpO2 97%   BMI 39.32 kg/m  Physical Exam Vitals and nursing note reviewed.  Constitutional:      General: She is not in acute distress.    Appearance: Normal appearance.  HENT:     Head: Normocephalic and  atraumatic.     Right Ear: External ear normal.     Left Ear: External ear normal.     Nose: Nose normal.     Mouth/Throat:     Mouth: Mucous membranes are moist.  Eyes:     General: No scleral icterus.       Right eye: No discharge.        Left eye: No discharge.  Cardiovascular:     Rate and Rhythm: Normal rate and regular rhythm.     Pulses: Normal pulses.     Heart sounds: Normal heart sounds.  Pulmonary:     Effort: Pulmonary effort is normal. No respiratory distress.     Breath sounds: Normal breath sounds. No stridor.  Abdominal:     General: Abdomen is flat. There is no distension.     Palpations: Abdomen is soft.     Tenderness: There is no abdominal tenderness. There is no guarding or rebound.  Musculoskeletal:     Cervical back: No rigidity.     Right lower leg: No edema.     Left lower leg: No edema.  Skin:    General: Skin is warm and dry.     Capillary Refill: Capillary refill takes less than 2 seconds.  Neurological:     Mental Status: She is alert.  Psychiatric:        Mood and Affect: Mood normal.        Behavior: Behavior normal. Behavior is cooperative.     ED Results and Treatments Labs (all labs ordered are listed, but only abnormal results are displayed) Labs Reviewed  COMPREHENSIVE METABOLIC PANEL - Abnormal; Notable for the following components:      Result Value   Glucose, Bld 105 (*)    Calcium 8.4 (*)    All other components within normal limits  GROUP A STREP BY PCR  RESP PANEL BY RT-PCR (RSV, FLU A&B, COVID)  RVPGX2  LIPASE, BLOOD  CBC  URINALYSIS, ROUTINE W REFLEX MICROSCOPIC  PREGNANCY, URINE  RAPID URINE DRUG SCREEN, HOSP PERFORMED  TROPONIN I (HIGH SENSITIVITY)  Radiology DG Chest Portable 1 View Result Date: 06/25/2023 CLINICAL DATA:  Cough and flu like symptoms EXAM: PORTABLE CHEST 1 VIEW COMPARISON:   12/31/2006 FINDINGS: The heart size and mediastinal contours are within normal limits. Both lungs are clear. The visualized skeletal structures are unremarkable. IMPRESSION: No active disease. Electronically Signed   By: Alcide Clever M.D.   On: 06/25/2023 00:17    Pertinent labs & imaging results that were available during my care of the patient were reviewed by me and considered in my medical decision making (see MDM for details).  Medications Ordered in ED Medications  ondansetron (ZOFRAN-ODT) disintegrating tablet 4 mg (4 mg Oral Given 06/24/23 2121)  ketorolac (TORADOL) 15 MG/ML injection 15 mg (15 mg Intramuscular Given 06/25/23 0206)  ondansetron (ZOFRAN-ODT) disintegrating tablet 4 mg (4 mg Oral Given 06/25/23 0206)                                                                                                                                     Procedures Procedures  (including critical care time)  Medical Decision Making / ED Course    Medical Decision Making:    Anniya L Bills is a 33 y.o. female with past medical history as below, significant for C-section who presents to the ED with complaint of nausea, vomiting, diarrhea, postnasal drip, cough, sore throat. The complaint involves an extensive differential diagnosis and also carries with it a high risk of complications and morbidity.  Serious etiology was considered. Ddx includes but is not limited to: Differential includes all life-threatening causes for chest pain. This includes but is not exclusive to acute coronary syndrome, aortic dissection, pulmonary embolism, cardiac tamponade, community-acquired pneumonia, pericarditis, musculoskeletal chest wall pain, viral, gastroenteritis, foodborne illness, UTI etc.  Complete initial physical exam performed, notably the patient was in no distress, asleep upon entering the room.    Reviewed and confirmed nursing documentation for past medical history, family history, social history.   Vital signs reviewed.       Brief summary: 33 year old female history as above here with nausea, vomiting, diarrhea, chest pain with coughing, rhinorrhea, postnasal drip, sore throat.  Ongoing around 48 hours.  Her exam is reassuring, abdomen is benign.  Lungs clear bilateral.  HDS.  Labs were in triage are reassuring.  Swab negative, strep negative, troponin negative.  EKG nonischemic.  Chest x-ray unremarkable.  Will give Toradol, p.o. challenge.    The patient's chest pain is not suggestive of pulmonary embolus, cardiac ischemia, aortic dissection, pericarditis, myocarditis, pulmonary embolism, pneumothorax, pneumonia, Zoster, or esophageal perforation, or other serious etiology.  Historically not abrupt in onset, tearing or ripping, pulses symmetric. EKG nonspecific for ischemia/infarction. No dysrhythmias, brugada, WPW, prolonged QT noted.   Troponin negative x1 (symptoms ongoing x2 days, only having cp w/ cough) CXR reviewed. Labs without demonstration of acute pathology unless otherwise noted above. Low HEART Score: 0-3 points (0.9-1.7% risk of MACE).]  Patient presents with vomiting, diarrhea, and abdominal cramping. Sx suggestive of enteritis or food born illness. Surgical or other more serious etiology appears very unlikely. The patient is improved with ED treatment.   Given the extremely low risk of these diagnoses further testing and evaluation for these possibilities does not appear to be indicated at this time. Patient in no distress and overall condition improved here in the ED. Detailed discussions were had with the patient regarding current findings, and need for close f/u with PCP or on call doctor. The patient has been instructed to return immediately if the symptoms worsen in any way for re-evaluation. Patient verbalized understanding and is in agreement with current care plan. All questions answered prior to discharge.            Additional history  obtained: -Additional history obtained from na -External records from outside source obtained and reviewed including: Chart review including previous notes, labs, imaging, consultation notes including  Primary care documentation, medications, prior labs   Lab Tests: -I ordered, reviewed, and interpreted labs.   The pertinent results include:   Labs Reviewed  COMPREHENSIVE METABOLIC PANEL - Abnormal; Notable for the following components:      Result Value   Glucose, Bld 105 (*)    Calcium 8.4 (*)    All other components within normal limits  GROUP A STREP BY PCR  RESP PANEL BY RT-PCR (RSV, FLU A&B, COVID)  RVPGX2  LIPASE, BLOOD  CBC  URINALYSIS, ROUTINE W REFLEX MICROSCOPIC  PREGNANCY, URINE  RAPID URINE DRUG SCREEN, HOSP PERFORMED  TROPONIN I (HIGH SENSITIVITY)    Notable for labs stable  EKG   EKG Interpretation Date/Time:    Ventricular Rate:    PR Interval:    QRS Duration:    QT Interval:    QTC Calculation:   R Axis:      Text Interpretation:           Imaging Studies ordered: I ordered imaging studies including cxr I independently visualized the following imaging with scope of interpretation limited to determining acute life threatening conditions related to emergency care; findings noted above I independently visualized and interpreted imaging. I agree with the radiologist interpretation   Medicines ordered and prescription drug management: Meds ordered this encounter  Medications   ondansetron (ZOFRAN-ODT) disintegrating tablet 4 mg   ketorolac (TORADOL) 15 MG/ML injection 15 mg   ondansetron (ZOFRAN-ODT) disintegrating tablet 4 mg   ondansetron (ZOFRAN) 4 MG tablet    Sig: Take 1 tablet (4 mg total) by mouth every 8 (eight) hours as needed for nausea or vomiting.    Dispense:  5 tablet    Refill:  0   sucralfate (CARAFATE) 1 g tablet    Sig: Take 1 tablet (1 g total) by mouth with breakfast, with lunch, and with evening meal for 7 days.     Dispense:  21 tablet    Refill:  0   oxymetazoline (AFRIN NASAL SPRAY) 0.05 % nasal spray    Sig: Place 1 spray into both nostrils 2 (two) times daily for 3 days.    Dispense:  15 mL    Refill:  0   guaiFENesin-dextromethorphan (ROBITUSSIN DM) 100-10 MG/5ML syrup    Sig: Take 5 mLs by mouth every 4 (four) hours as needed for cough.    Dispense:  118 mL    Refill:  0   acetaminophen (TYLENOL) 325 MG tablet    Sig: Take 2 tablets (650 mg total) by mouth  every 6 (six) hours as needed.    Dispense:  36 tablet    Refill:  0   ibuprofen (ADVIL) 600 MG tablet    Sig: Take 1 tablet (600 mg total) by mouth every 6 (six) hours as needed.    Dispense:  30 tablet    Refill:  0    -I have reviewed the patients home medicines and have made adjustments as needed   Consultations Obtained: na   Cardiac Monitoring: The patient was maintained on a cardiac monitor.  I personally viewed and interpreted the cardiac monitored which showed an underlying rhythm of: nsr Continuous pulse oximetry interpreted by myself, 100% on RA.    Social Determinants of Health:  Diagnosis or treatment significantly limited by social determinants of health: current smoker and obesity Counseled patient for approximately 3 minutes regarding smoking cessation. Discussed risks of smoking and how they applied and affected their visit here today. Patient not ready to quit at this time, however will follow up with their primary doctor when they are.   CPT code: 36644: intermediate counseling for smoking cessation     Reevaluation: After the interventions noted above, I reevaluated the patient and found that they have improved  Co morbidities that complicate the patient evaluation  Past Medical History:  Diagnosis Date   UTI (lower urinary tract infection) 2012   during 2 pregnancy      Dispostion: Disposition decision including need for hospitalization was considered, and patient discharged from emergency  department.    Final Clinical Impression(s) / ED Diagnoses Final diagnoses:  Atypical chest pain  Nausea vomiting and diarrhea        Sloan Leiter, DO 06/25/23 0425

## 2023-06-25 NOTE — ED Notes (Signed)
Pt. Given PO fluids 

## 2023-06-25 NOTE — ED Notes (Signed)
 Pt. Tolerated fluids well denies N/V

## 2023-10-06 ENCOUNTER — Emergency Department (HOSPITAL_BASED_OUTPATIENT_CLINIC_OR_DEPARTMENT_OTHER)
Admission: EM | Admit: 2023-10-06 | Discharge: 2023-10-06 | Discharge status: Against Medical Advice | Attending: Emergency Medicine | Admitting: Emergency Medicine

## 2023-10-06 ENCOUNTER — Encounter (HOSPITAL_BASED_OUTPATIENT_CLINIC_OR_DEPARTMENT_OTHER): Payer: Self-pay | Admitting: Emergency Medicine

## 2023-10-06 ENCOUNTER — Other Ambulatory Visit: Payer: Self-pay

## 2023-10-06 DIAGNOSIS — M25511 Pain in right shoulder: Secondary | ICD-10-CM | POA: Diagnosis present

## 2023-10-06 DIAGNOSIS — R200 Anesthesia of skin: Secondary | ICD-10-CM | POA: Insufficient documentation

## 2023-10-06 DIAGNOSIS — M542 Cervicalgia: Secondary | ICD-10-CM | POA: Diagnosis not present

## 2023-10-06 DIAGNOSIS — Z5321 Procedure and treatment not carried out due to patient leaving prior to being seen by health care provider: Secondary | ICD-10-CM | POA: Insufficient documentation

## 2023-10-06 MED ORDER — IBUPROFEN 400 MG PO TABS
400.0000 mg | ORAL_TABLET | Freq: Once | ORAL | Status: AC | PRN
  Administered 2023-10-06: 400 mg via ORAL
  Filled 2023-10-06: qty 1

## 2023-10-06 NOTE — ED Triage Notes (Addendum)
 Pt reports RT pointer finger numbness x 4d, also c/o rt shoulder blade and neck pain, pulses and cap refill WNL, denies tx or taking any medication for pain; denies any injury
